# Patient Record
Sex: Female | Born: 1985
Health system: Southern US, Community
[De-identification: ages and names within clinical notes are randomized; demographics above are authoritative.]

## PROBLEM LIST (undated history)

## (undated) DIAGNOSIS — M2550 Pain in unspecified joint: Secondary | ICD-10-CM

## (undated) DIAGNOSIS — I10 Essential (primary) hypertension: Secondary | ICD-10-CM

## (undated) DIAGNOSIS — E039 Hypothyroidism, unspecified: Secondary | ICD-10-CM

## (undated) DIAGNOSIS — O1490 Unspecified pre-eclampsia, unspecified trimester: Secondary | ICD-10-CM

## (undated) DIAGNOSIS — R0602 Shortness of breath: Secondary | ICD-10-CM

## (undated) DIAGNOSIS — M549 Dorsalgia, unspecified: Secondary | ICD-10-CM

## (undated) DIAGNOSIS — K219 Gastro-esophageal reflux disease without esophagitis: Secondary | ICD-10-CM

## (undated) DIAGNOSIS — T7840XA Allergy, unspecified, initial encounter: Secondary | ICD-10-CM

## (undated) DIAGNOSIS — J45909 Unspecified asthma, uncomplicated: Secondary | ICD-10-CM

## (undated) HISTORY — PX: WISDOM TOOTH EXTRACTION: SHX21

## (undated) HISTORY — DX: Pain in unspecified joint: M25.50

## (undated) HISTORY — DX: Hypothyroidism, unspecified: E03.9

## (undated) HISTORY — DX: Allergy, unspecified, initial encounter: T78.40XA

## (undated) HISTORY — DX: Shortness of breath: R06.02

## (undated) HISTORY — DX: Unspecified asthma, uncomplicated: J45.909

## (undated) HISTORY — DX: Dorsalgia, unspecified: M54.9

## (undated) HISTORY — DX: Gastro-esophageal reflux disease without esophagitis: K21.9

---
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
## 2005-06-26 IMAGING — CR DG HIP (WITH OR WITHOUT PELVIS) 2-3V*L*
2 series · 2 of 2 positions shown · non-contrast
Comparison: none

CLINICAL DATA: Silver trauma.  Pain.  
 CERVICAL SPINE COMPLETE
 There is no evidence of fracture or prevertebral soft tissue swelling. Alignment is normal. The intervertebral disk spaces are within normal limits and no other significant bone abnormalities are identified.

[view not recorded (1 of 2)]
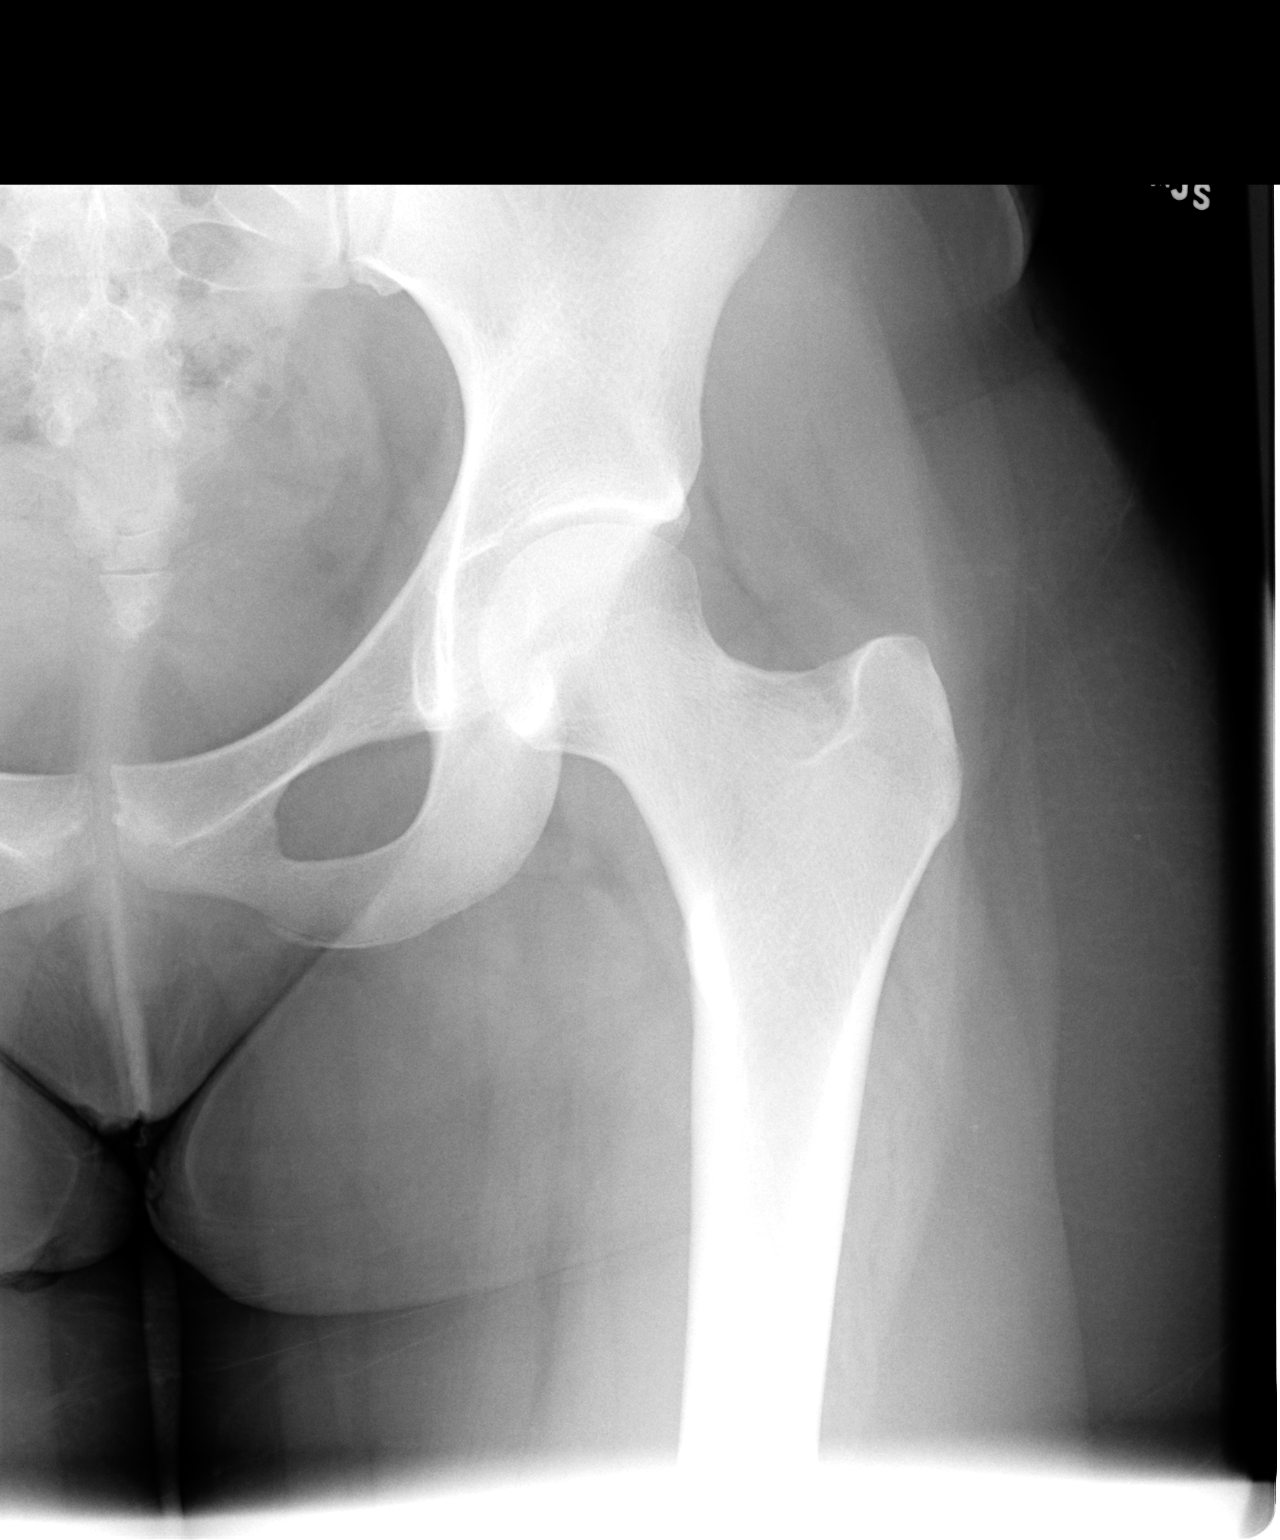

[view not recorded (2 of 2)]
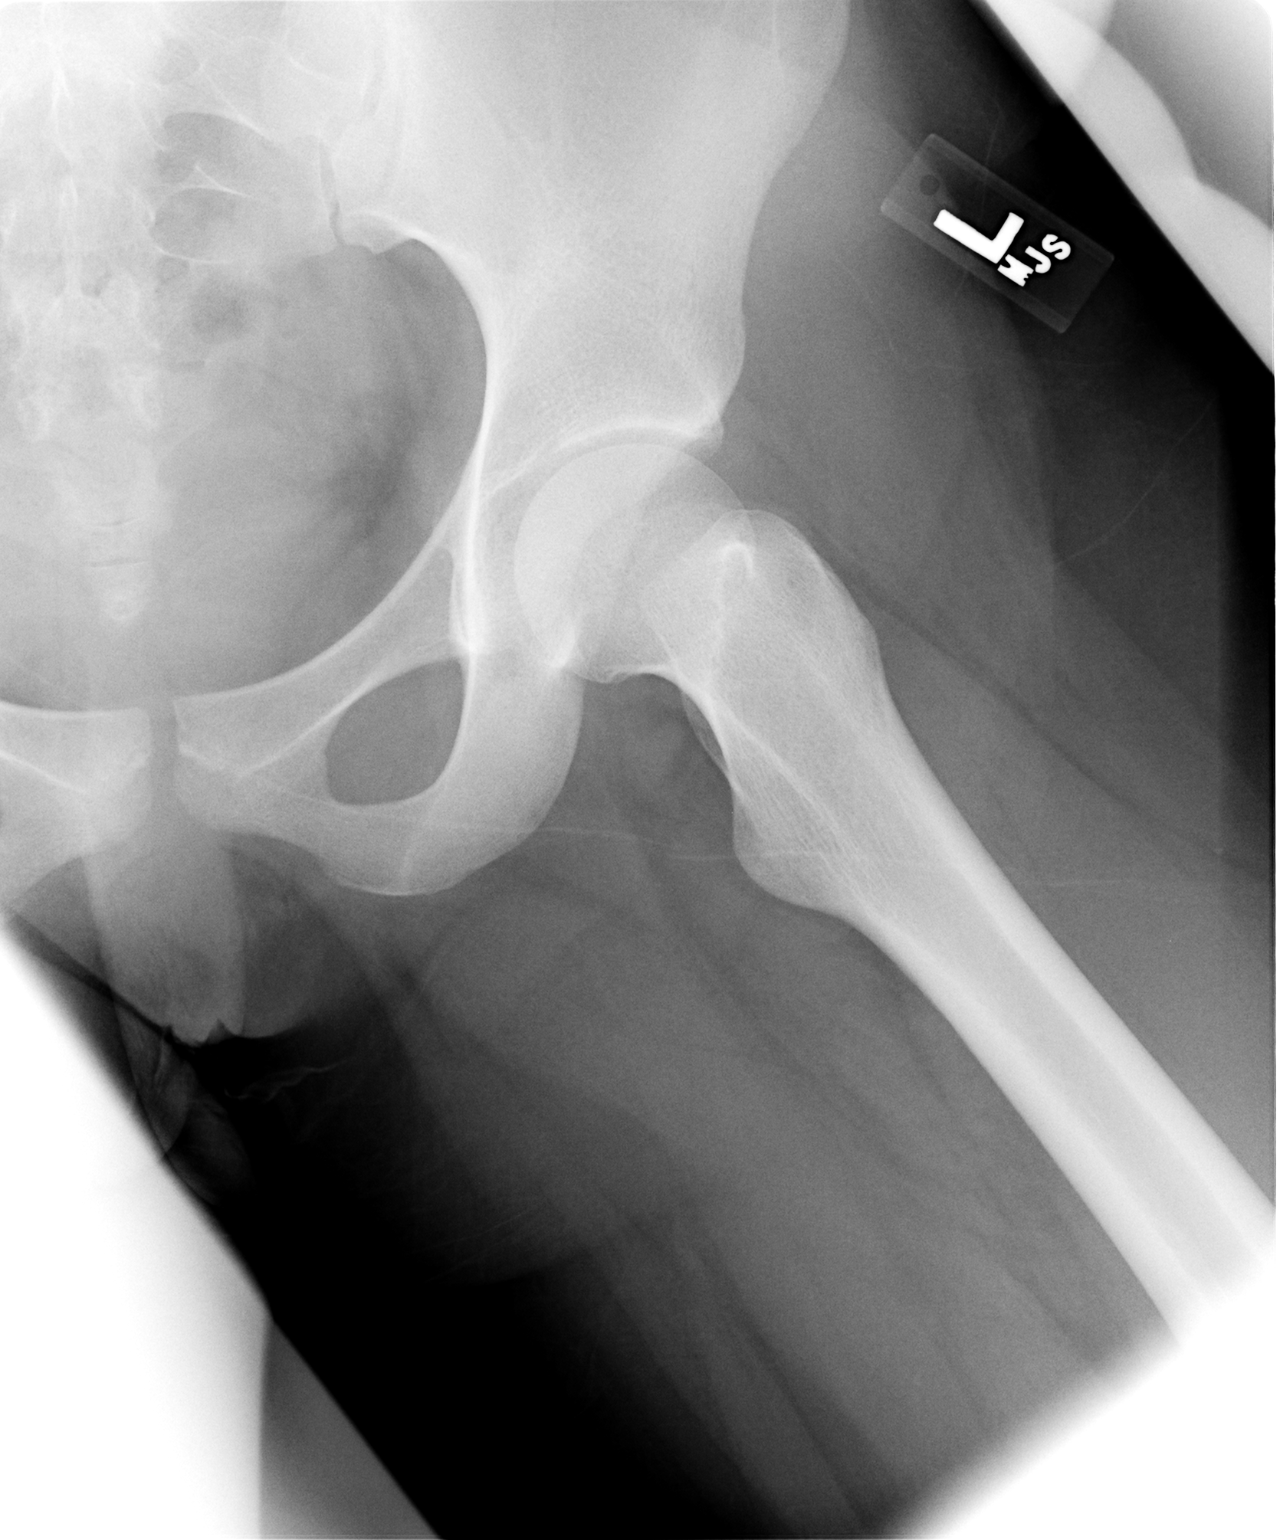

[2 of 2 positions shown; findings below may reference images not displayed]

IMPRESSION
 Negative cervical spine radiographs. 
 LEFT HUMERUS
 There is no evidence of fracture or focal bone lesions. No other significant bone or soft tissue abnormalities are identified.

 IMPRESSION
 Normal study.
 LEFT ELBOW COMPLETE
 There is no evidence of fracture, dislocation, or other significant bone abnormality.  There is no evidence of joint effusion. 

 IMPRESSION
 Normal study. 
 LEFT FEMUR
 There is no evidence of fracture or focal bone lesions. No other significant bone or soft tissue abnormalities are identified.

 IMPRESSION
 Normal study.
 ONE VIEW PELVIS

  There is no evidence of fracture or diastasis. No other significant bone or soft tissue abnormalities are identified.

 IMPRESSION
 Normal study.

 LEFT HIP COMPLETE

  There is no evidence of fracture or dislocation. No other significant bone or soft tissue abnormalities are identified. The joint spaces are within normal limits.

 IMPRESSION
 Normal study.

## 2005-06-26 IMAGING — CR DG FEMUR 2V*L*
2 series · 2 of 2 positions shown · non-contrast
Comparison: none

CLINICAL DATA: Silver trauma.  Pain.  
 CERVICAL SPINE COMPLETE
 There is no evidence of fracture or prevertebral soft tissue swelling. Alignment is normal. The intervertebral disk spaces are within normal limits and no other significant bone abnormalities are identified.

[view not recorded (1 of 2)]
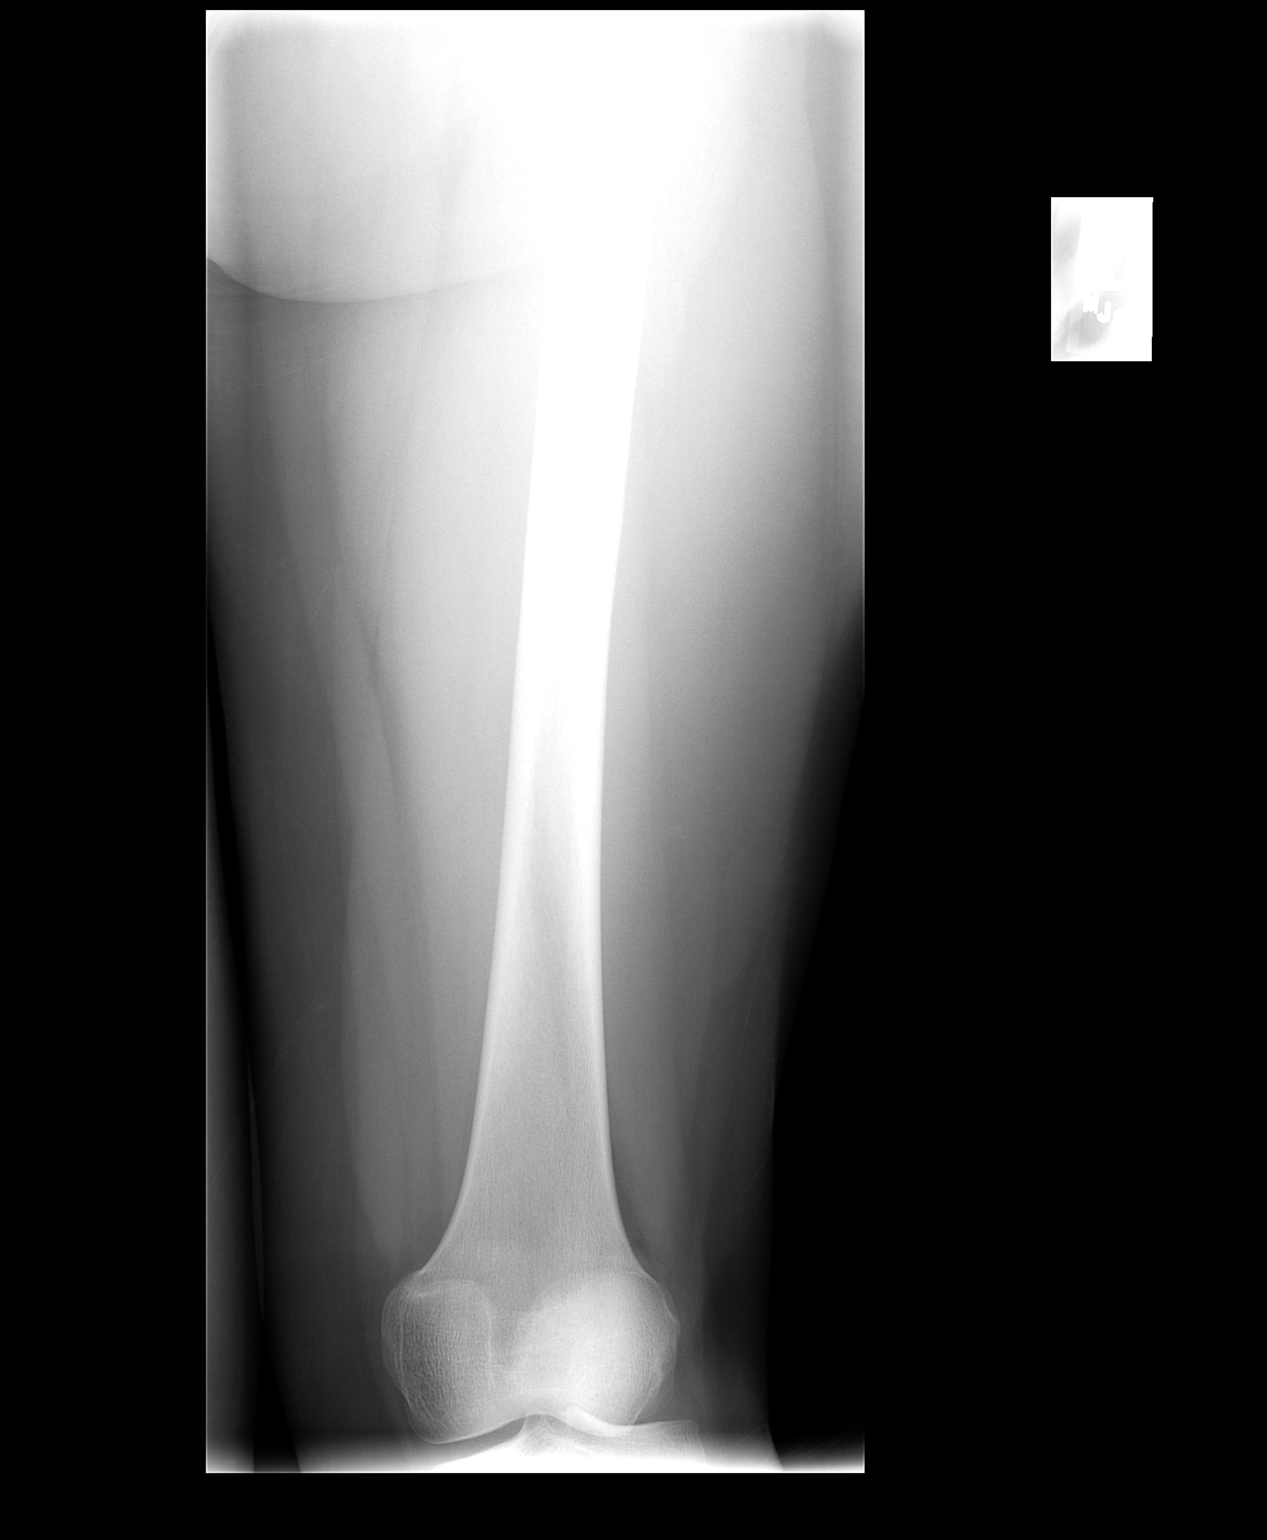

[view not recorded (2 of 2)]
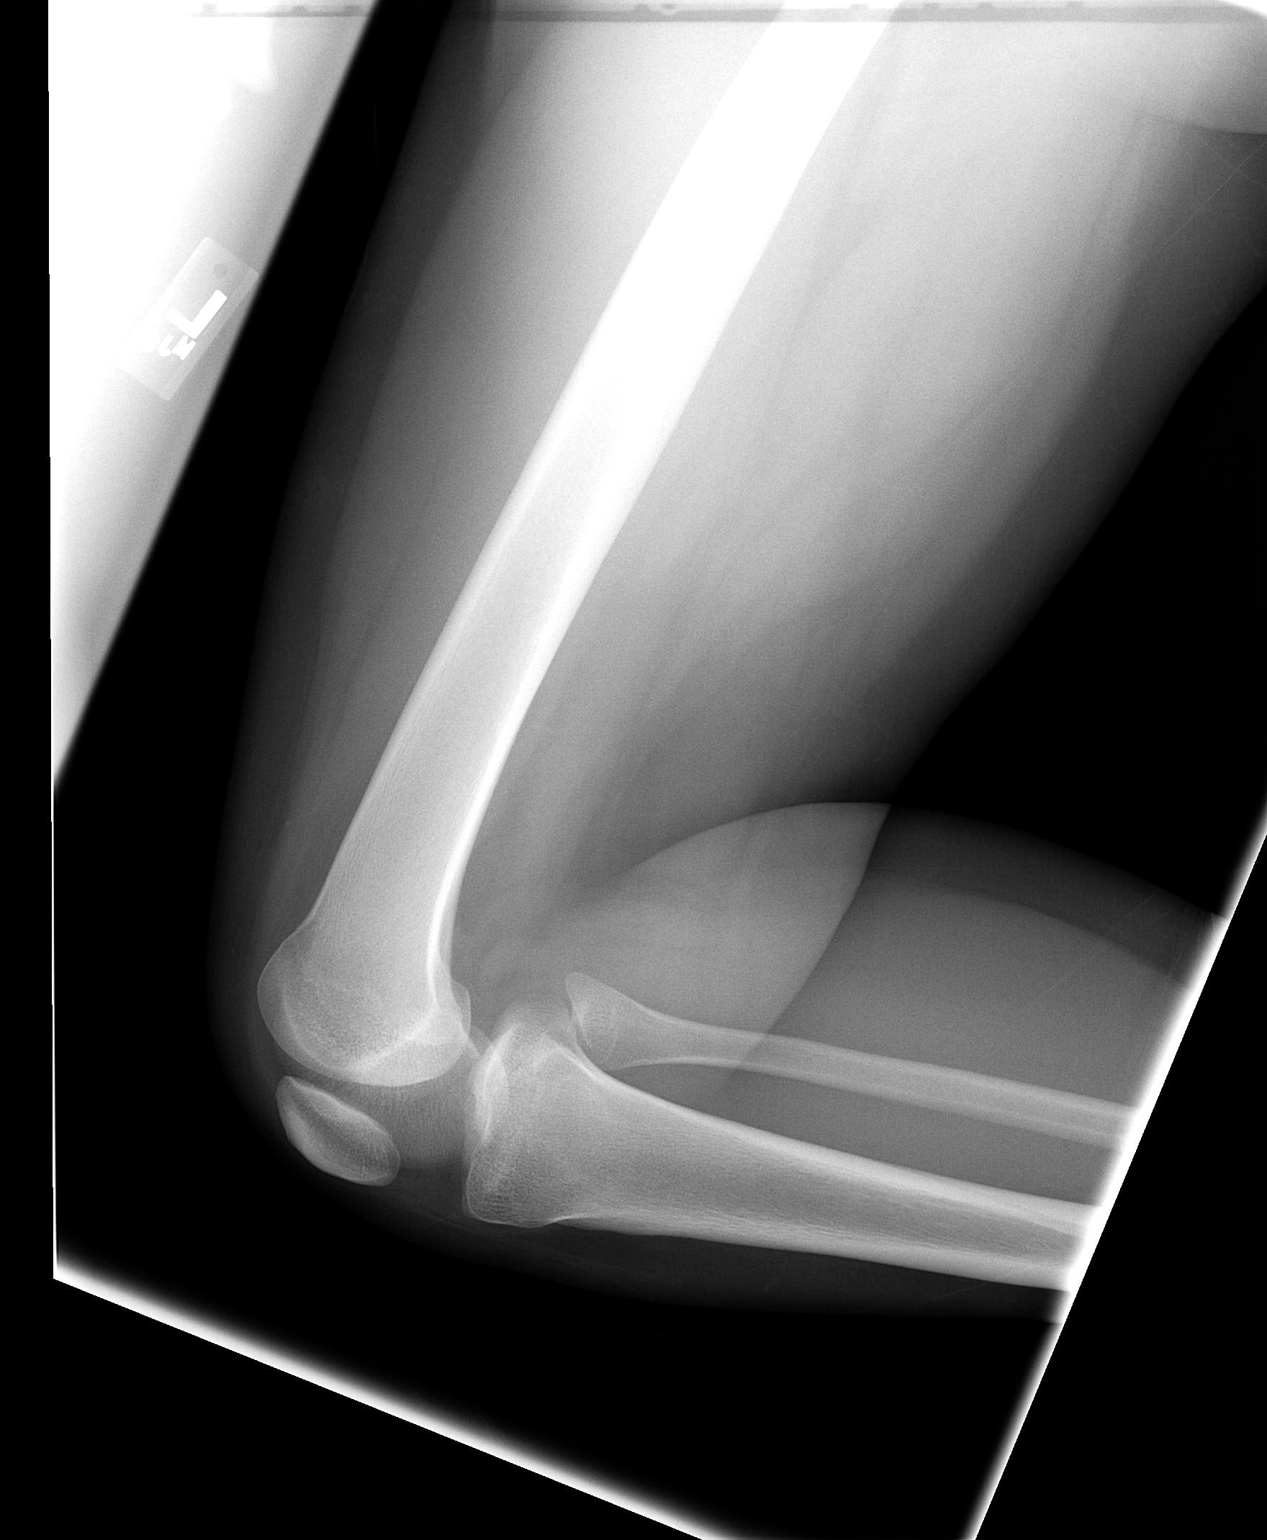

[2 of 2 positions shown; findings below may reference images not displayed]

IMPRESSION
 Negative cervical spine radiographs. 
 LEFT HUMERUS
 There is no evidence of fracture or focal bone lesions. No other significant bone or soft tissue abnormalities are identified.

 IMPRESSION
 Normal study.
 LEFT ELBOW COMPLETE
 There is no evidence of fracture, dislocation, or other significant bone abnormality.  There is no evidence of joint effusion. 

 IMPRESSION
 Normal study. 
 LEFT FEMUR
 There is no evidence of fracture or focal bone lesions. No other significant bone or soft tissue abnormalities are identified.

 IMPRESSION
 Normal study.
 ONE VIEW PELVIS

  There is no evidence of fracture or diastasis. No other significant bone or soft tissue abnormalities are identified.

 IMPRESSION
 Normal study.

 LEFT HIP COMPLETE

  There is no evidence of fracture or dislocation. No other significant bone or soft tissue abnormalities are identified. The joint spaces are within normal limits.

 IMPRESSION
 Normal study.

## 2015-11-05 DIAGNOSIS — R87610 Atypical squamous cells of undetermined significance on cytologic smear of cervix (ASC-US): Secondary | ICD-10-CM | POA: Diagnosis not present

## 2015-11-05 DIAGNOSIS — Z124 Encounter for screening for malignant neoplasm of cervix: Secondary | ICD-10-CM | POA: Diagnosis not present

## 2015-11-05 DIAGNOSIS — Z0000 Encounter for general adult medical examination without abnormal findings: Secondary | ICD-10-CM | POA: Diagnosis not present

## 2015-11-05 DIAGNOSIS — K219 Gastro-esophageal reflux disease without esophagitis: Secondary | ICD-10-CM | POA: Diagnosis not present

## 2015-12-04 DIAGNOSIS — B977 Papillomavirus as the cause of diseases classified elsewhere: Secondary | ICD-10-CM | POA: Diagnosis not present

## 2015-12-04 DIAGNOSIS — J029 Acute pharyngitis, unspecified: Secondary | ICD-10-CM | POA: Diagnosis not present

## 2015-12-04 DIAGNOSIS — R87810 Cervical high risk human papillomavirus (HPV) DNA test positive: Secondary | ICD-10-CM | POA: Diagnosis not present

## 2015-12-04 DIAGNOSIS — R87610 Atypical squamous cells of undetermined significance on cytologic smear of cervix (ASC-US): Secondary | ICD-10-CM | POA: Diagnosis not present

## 2015-12-04 DIAGNOSIS — Z1151 Encounter for screening for human papillomavirus (HPV): Secondary | ICD-10-CM | POA: Diagnosis not present

## 2016-04-01 DIAGNOSIS — Z01419 Encounter for gynecological examination (general) (routine) without abnormal findings: Secondary | ICD-10-CM | POA: Diagnosis not present

## 2016-04-01 DIAGNOSIS — Z1329 Encounter for screening for other suspected endocrine disorder: Secondary | ICD-10-CM | POA: Diagnosis not present

## 2016-04-01 DIAGNOSIS — E669 Obesity, unspecified: Secondary | ICD-10-CM | POA: Diagnosis not present

## 2016-04-01 DIAGNOSIS — Z1389 Encounter for screening for other disorder: Secondary | ICD-10-CM | POA: Diagnosis not present

## 2016-04-01 DIAGNOSIS — Z131 Encounter for screening for diabetes mellitus: Secondary | ICD-10-CM | POA: Diagnosis not present

## 2016-04-01 DIAGNOSIS — Z6832 Body mass index (BMI) 32.0-32.9, adult: Secondary | ICD-10-CM | POA: Diagnosis not present

## 2016-04-26 DIAGNOSIS — H5212 Myopia, left eye: Secondary | ICD-10-CM | POA: Diagnosis not present

## 2016-04-26 DIAGNOSIS — H43313 Vitreous membranes and strands, bilateral: Secondary | ICD-10-CM | POA: Diagnosis not present

## 2016-05-03 DIAGNOSIS — Z6832 Body mass index (BMI) 32.0-32.9, adult: Secondary | ICD-10-CM | POA: Diagnosis not present

## 2016-05-03 DIAGNOSIS — R102 Pelvic and perineal pain: Secondary | ICD-10-CM | POA: Diagnosis not present

## 2016-05-03 DIAGNOSIS — E669 Obesity, unspecified: Secondary | ICD-10-CM | POA: Diagnosis not present

## 2016-11-04 DIAGNOSIS — M25562 Pain in left knee: Secondary | ICD-10-CM | POA: Diagnosis not present

## 2016-11-04 DIAGNOSIS — M222X2 Patellofemoral disorders, left knee: Secondary | ICD-10-CM | POA: Diagnosis not present

## 2016-12-21 DIAGNOSIS — M25562 Pain in left knee: Secondary | ICD-10-CM | POA: Diagnosis not present

## 2017-02-11 DIAGNOSIS — Z01419 Encounter for gynecological examination (general) (routine) without abnormal findings: Secondary | ICD-10-CM | POA: Diagnosis not present

## 2017-02-24 DIAGNOSIS — Z131 Encounter for screening for diabetes mellitus: Secondary | ICD-10-CM | POA: Diagnosis not present

## 2017-02-24 DIAGNOSIS — Z13220 Encounter for screening for lipoid disorders: Secondary | ICD-10-CM | POA: Diagnosis not present

## 2017-04-25 DIAGNOSIS — J069 Acute upper respiratory infection, unspecified: Secondary | ICD-10-CM | POA: Diagnosis not present

## 2017-06-30 DIAGNOSIS — Z23 Encounter for immunization: Secondary | ICD-10-CM | POA: Diagnosis not present

## 2018-04-15 DIAGNOSIS — J040 Acute laryngitis: Secondary | ICD-10-CM | POA: Diagnosis not present

## 2018-04-28 DIAGNOSIS — J069 Acute upper respiratory infection, unspecified: Secondary | ICD-10-CM | POA: Diagnosis not present

## 2018-05-09 DIAGNOSIS — J019 Acute sinusitis, unspecified: Secondary | ICD-10-CM | POA: Diagnosis not present

## 2018-06-19 DIAGNOSIS — Z131 Encounter for screening for diabetes mellitus: Secondary | ICD-10-CM | POA: Diagnosis not present

## 2018-06-19 DIAGNOSIS — Z13220 Encounter for screening for lipoid disorders: Secondary | ICD-10-CM | POA: Diagnosis not present

## 2018-06-19 DIAGNOSIS — Z6835 Body mass index (BMI) 35.0-35.9, adult: Secondary | ICD-10-CM | POA: Diagnosis not present

## 2018-06-19 DIAGNOSIS — Z0000 Encounter for general adult medical examination without abnormal findings: Secondary | ICD-10-CM | POA: Diagnosis not present

## 2018-06-19 DIAGNOSIS — E669 Obesity, unspecified: Secondary | ICD-10-CM | POA: Diagnosis not present

## 2018-06-19 DIAGNOSIS — Z01419 Encounter for gynecological examination (general) (routine) without abnormal findings: Secondary | ICD-10-CM | POA: Diagnosis not present

## 2018-07-02 DIAGNOSIS — J0110 Acute frontal sinusitis, unspecified: Secondary | ICD-10-CM | POA: Diagnosis not present

## 2018-07-06 DIAGNOSIS — J0110 Acute frontal sinusitis, unspecified: Secondary | ICD-10-CM | POA: Diagnosis not present

## 2019-06-11 DIAGNOSIS — R05 Cough: Secondary | ICD-10-CM | POA: Diagnosis not present

## 2019-06-16 DIAGNOSIS — R05 Cough: Secondary | ICD-10-CM | POA: Diagnosis not present

## 2019-06-22 DIAGNOSIS — R05 Cough: Secondary | ICD-10-CM | POA: Diagnosis not present

## 2019-08-09 DIAGNOSIS — Z13220 Encounter for screening for lipoid disorders: Secondary | ICD-10-CM | POA: Diagnosis not present

## 2019-08-09 DIAGNOSIS — B977 Papillomavirus as the cause of diseases classified elsewhere: Secondary | ICD-10-CM | POA: Diagnosis not present

## 2019-08-09 DIAGNOSIS — Z130 Encounter for screening for diseases of the blood and blood-forming organs and certain disorders involving the immune mechanism: Secondary | ICD-10-CM | POA: Diagnosis not present

## 2019-08-09 DIAGNOSIS — Z1329 Encounter for screening for other suspected endocrine disorder: Secondary | ICD-10-CM | POA: Diagnosis not present

## 2019-08-09 DIAGNOSIS — D225 Melanocytic nevi of trunk: Secondary | ICD-10-CM | POA: Diagnosis not present

## 2019-08-09 DIAGNOSIS — Z124 Encounter for screening for malignant neoplasm of cervix: Secondary | ICD-10-CM | POA: Diagnosis not present

## 2019-08-09 DIAGNOSIS — Z01419 Encounter for gynecological examination (general) (routine) without abnormal findings: Secondary | ICD-10-CM | POA: Diagnosis not present

## 2019-08-09 DIAGNOSIS — R7301 Impaired fasting glucose: Secondary | ICD-10-CM | POA: Diagnosis not present

## 2019-08-09 DIAGNOSIS — L821 Other seborrheic keratosis: Secondary | ICD-10-CM | POA: Diagnosis not present

## 2019-08-09 DIAGNOSIS — L218 Other seborrheic dermatitis: Secondary | ICD-10-CM | POA: Diagnosis not present

## 2019-08-09 DIAGNOSIS — K219 Gastro-esophageal reflux disease without esophagitis: Secondary | ICD-10-CM | POA: Diagnosis not present

## 2019-08-09 DIAGNOSIS — Z6837 Body mass index (BMI) 37.0-37.9, adult: Secondary | ICD-10-CM | POA: Diagnosis not present

## 2019-08-09 DIAGNOSIS — R7989 Other specified abnormal findings of blood chemistry: Secondary | ICD-10-CM | POA: Diagnosis not present

## 2019-08-09 DIAGNOSIS — Z1151 Encounter for screening for human papillomavirus (HPV): Secondary | ICD-10-CM | POA: Diagnosis not present

## 2019-08-09 DIAGNOSIS — N72 Inflammatory disease of cervix uteri: Secondary | ICD-10-CM | POA: Diagnosis not present

## 2019-08-09 DIAGNOSIS — Z0000 Encounter for general adult medical examination without abnormal findings: Secondary | ICD-10-CM | POA: Diagnosis not present

## 2019-08-09 DIAGNOSIS — L814 Other melanin hyperpigmentation: Secondary | ICD-10-CM | POA: Diagnosis not present

## 2019-08-09 DIAGNOSIS — R635 Abnormal weight gain: Secondary | ICD-10-CM | POA: Diagnosis not present

## 2019-10-03 DIAGNOSIS — E039 Hypothyroidism, unspecified: Secondary | ICD-10-CM | POA: Diagnosis not present

## 2019-10-17 DIAGNOSIS — Z3149 Encounter for other procreative investigation and testing: Secondary | ICD-10-CM | POA: Diagnosis not present

## 2019-11-05 DIAGNOSIS — J00 Acute nasopharyngitis [common cold]: Secondary | ICD-10-CM | POA: Diagnosis not present

## 2019-11-13 DIAGNOSIS — J019 Acute sinusitis, unspecified: Secondary | ICD-10-CM | POA: Diagnosis not present

## 2019-11-13 DIAGNOSIS — B9689 Other specified bacterial agents as the cause of diseases classified elsewhere: Secondary | ICD-10-CM | POA: Diagnosis not present

## 2019-11-13 DIAGNOSIS — J45909 Unspecified asthma, uncomplicated: Secondary | ICD-10-CM | POA: Insufficient documentation

## 2019-11-13 DIAGNOSIS — R05 Cough: Secondary | ICD-10-CM | POA: Diagnosis not present

## 2019-11-13 DIAGNOSIS — R0981 Nasal congestion: Secondary | ICD-10-CM | POA: Diagnosis not present

## 2019-11-13 DIAGNOSIS — K219 Gastro-esophageal reflux disease without esophagitis: Secondary | ICD-10-CM | POA: Insufficient documentation

## 2019-11-16 DIAGNOSIS — E309 Disorder of puberty, unspecified: Secondary | ICD-10-CM | POA: Diagnosis not present

## 2019-11-16 DIAGNOSIS — E039 Hypothyroidism, unspecified: Secondary | ICD-10-CM | POA: Diagnosis not present

## 2020-11-26 DIAGNOSIS — N970 Female infertility associated with anovulation: Secondary | ICD-10-CM | POA: Diagnosis not present

## 2020-11-26 DIAGNOSIS — Z319 Encounter for procreative management, unspecified: Secondary | ICD-10-CM | POA: Diagnosis not present

## 2020-11-26 LAB — HEMOGLOBIN A1C: Hemoglobin A1C: 5.6

## 2020-11-26 LAB — TSH: TSH: 3.69 (ref <=5.90)

## 2020-12-10 ENCOUNTER — Other Ambulatory Visit: Payer: Self-pay

## 2020-12-10 ENCOUNTER — Encounter (INDEPENDENT_AMBULATORY_CARE_PROVIDER_SITE_OTHER): Payer: Self-pay | Admitting: Bariatrics

## 2020-12-10 ENCOUNTER — Ambulatory Visit (INDEPENDENT_AMBULATORY_CARE_PROVIDER_SITE_OTHER): Payer: BC Managed Care – PPO | Admitting: Bariatrics

## 2020-12-10 VITALS — BP 143/84 | HR 78 | Temp 98.0°F | Ht 64.0 in | Wt 205.0 lb

## 2020-12-10 DIAGNOSIS — K219 Gastro-esophageal reflux disease without esophagitis: Secondary | ICD-10-CM

## 2020-12-10 DIAGNOSIS — E559 Vitamin D deficiency, unspecified: Secondary | ICD-10-CM | POA: Diagnosis not present

## 2020-12-10 DIAGNOSIS — R5383 Other fatigue: Secondary | ICD-10-CM | POA: Diagnosis not present

## 2020-12-10 DIAGNOSIS — Z6835 Body mass index (BMI) 35.0-35.9, adult: Secondary | ICD-10-CM

## 2020-12-10 DIAGNOSIS — E038 Other specified hypothyroidism: Secondary | ICD-10-CM | POA: Diagnosis not present

## 2020-12-10 DIAGNOSIS — R0602 Shortness of breath: Secondary | ICD-10-CM

## 2020-12-10 DIAGNOSIS — R7309 Other abnormal glucose: Secondary | ICD-10-CM | POA: Diagnosis not present

## 2020-12-10 DIAGNOSIS — Z1331 Encounter for screening for depression: Secondary | ICD-10-CM | POA: Diagnosis not present

## 2020-12-10 DIAGNOSIS — E7800 Pure hypercholesterolemia, unspecified: Secondary | ICD-10-CM

## 2020-12-10 DIAGNOSIS — Z0289 Encounter for other administrative examinations: Secondary | ICD-10-CM

## 2020-12-10 DIAGNOSIS — Z9189 Other specified personal risk factors, not elsewhere classified: Secondary | ICD-10-CM | POA: Diagnosis not present

## 2020-12-10 DIAGNOSIS — M25569 Pain in unspecified knee: Secondary | ICD-10-CM

## 2020-12-10 DIAGNOSIS — E66812 Obesity, class 2: Secondary | ICD-10-CM

## 2020-12-11 ENCOUNTER — Encounter (INDEPENDENT_AMBULATORY_CARE_PROVIDER_SITE_OTHER): Payer: Self-pay | Admitting: Bariatrics

## 2020-12-11 DIAGNOSIS — E66812 Obesity, class 2: Secondary | ICD-10-CM | POA: Insufficient documentation

## 2020-12-11 DIAGNOSIS — E559 Vitamin D deficiency, unspecified: Secondary | ICD-10-CM | POA: Insufficient documentation

## 2020-12-11 DIAGNOSIS — E6609 Other obesity due to excess calories: Secondary | ICD-10-CM | POA: Insufficient documentation

## 2020-12-11 DIAGNOSIS — E7800 Pure hypercholesterolemia, unspecified: Secondary | ICD-10-CM | POA: Insufficient documentation

## 2020-12-11 LAB — LIPID PANEL WITH LDL/HDL RATIO
Cholesterol, Total: 225 mg/dL — ABNORMAL HIGH (ref 100–199)
HDL: 38 mg/dL — ABNORMAL LOW (ref >39)
LDL Chol Calc (NIH): 155 mg/dL — ABNORMAL HIGH (ref 0–99)
LDL/HDL Ratio: 4.1 ratio — ABNORMAL HIGH (ref 0.0–3.2)
Triglycerides: 176 mg/dL — ABNORMAL HIGH (ref 0–149)
VLDL Cholesterol Cal: 32 mg/dL (ref 5–40)

## 2020-12-11 LAB — INSULIN, RANDOM: INSULIN: 16.6 u[IU]/mL (ref 2.6–24.9)

## 2020-12-11 LAB — VITAMIN D 25 HYDROXY (VIT D DEFICIENCY, FRACTURES): Vit D, 25-Hydroxy: 18.9 ng/mL — ABNORMAL LOW (ref 30.0–100.0)

## 2020-12-16 ENCOUNTER — Encounter (INDEPENDENT_AMBULATORY_CARE_PROVIDER_SITE_OTHER): Payer: Self-pay | Admitting: Bariatrics

## 2020-12-16 ENCOUNTER — Telehealth (INDEPENDENT_AMBULATORY_CARE_PROVIDER_SITE_OTHER): Payer: Self-pay

## 2020-12-16 NOTE — Telephone Encounter (Signed)
Pts medical records request was faxed to [facility] on 12/10/2020, the request was successfully received on 12/10/2020.

## 2020-12-16 NOTE — Progress Notes (Signed)
Chief Complaint:   OBESITY Sarah Levy (MR# [id number]) is a 35 y.o. female who presents for evaluation and treatment of obesity and related comorbidities. Current BMI is Body mass index is 35.19 kg/m. Sarah Levy has been struggling with her weight for many years and has been unsuccessful in either losing weight, maintaining weight loss, or reaching her healthy weight goal.  Sarah Levy is currently in the action stage of change and ready to dedicate time achieving and maintaining a healthier weight. Sarah Levy is interested in becoming our patient and working on intensive lifestyle modifications including (but not limited to) diet and exercise for weight loss.  Sarah Levy enjoys cooking. She craves carbohydrates at night. She needs to lose weight for fertility treatment.  Sarah Levy's habits were reviewed today and are as follows: Her family eats meals together, she thinks her family will eat healthier with her, her desired weight loss is 70 lbs, she started gaining weight 6 years ago, her heaviest weight ever was 210 pounds, she is a picky eater and doesn't like to eat healthier foods, she has significant food cravings issues, she snacks frequently in the evenings, she skips meals frequently, she is frequently drinking liquids with calories, she frequently makes poor food choices, she frequently eats larger portions than normal, and she struggles with emotional eating.  Depression Screen Sarah Levy's Food and Mood (modified PHQ-9) score was 5.  Depression screen PHQ 2/9 12/10/2020  Decreased Interest 1  Down, Depressed, Hopeless 1  PHQ - 2 Score 2  Altered sleeping 1  Tired, decreased energy 2  Feeling bad or failure about yourself  0  Trouble concentrating 0  Moving slowly or fidgety/restless 0  Suicidal thoughts 0  PHQ-9 Score 5  Difficult doing work/chores Not difficult at all   Subjective:   1. Other fatigue Sarah Levy admits to daytime somnolence and admits to waking up still tired.  Patent has a history of symptoms of daytime fatigue and morning headache. Sarah Levy generally gets 6 or 7 hours of sleep per night, and states that she has generally restful sleep. Snoring is present. Apneic episodes are not present. Epworth Sleepiness Score is 2.  2. SOB (shortness of breath) on exertion Sarah Levy notes increasing shortness of breath with exercising and seems to be worsening over time with weight gain. She notes getting out of breath sooner with activity than she used to. This has gotten worse recently. Sarah Levy denies shortness of breath at rest or orthopnea.  3. Other specified hypothyroidism Sarah Levy's last TSH was 3.690.  4. Knee pain, unspecified chronicity, unspecified laterality [name] is taking ibuprofen.  5. Gastroesophageal reflux disease, unspecified whether esophagitis present Sarah Levy is taking pantoprazole.  6. Vitamin D deficiency Sarah Levy is not on Vit D supplementation.  7. Elevated cholesterol Sarah Levy is not on statin therapy.  8. Elevated glucose Sarah Levy has a family history of diabetes.   9. At risk for activity intolerance Sarah Levy is at risk for exercise intolerance due to back pain.  Assessment/Plan:   1. Other fatigue Sarah Levy does feel that her weight is causing her energy to be lower than it should be. Fatigue may be related to obesity, depression or many other causes. Labs will be ordered, and in the meanwhile, Sarah Levy will focus on self care including making healthy food choices, increasing physical activity and focusing on stress reduction. Check labs today.  - EKG 12-Lead - Insulin, random - Lipid Panel With LDL/HDL Ratio - VITAMIN D 25 Hydroxy (Vit-D Deficiency, Fractures)  2. SOB (shortness  of breath) on exertion Sarah Levy does feel that she gets out of breath more easily that she used to when she exercises. Sarah Levy's shortness of breath appears to be obesity related and exercise induced. She has agreed to work on weight loss and gradually  increase exercise to treat her exercise induced shortness of breath. Will continue to monitor closely.  3. Other specified hypothyroidism Patient with long-standing hypothyroidism, on levothyroxine therapy. She appears euthyroid. Orders and follow up as documented in patient record. Follow up with OB/GYN.  Counseling Good thyroid control is important for overall health. Supratherapeutic thyroid levels are dangerous and will not improve weight loss results. The correct way to take levothyroxine is fasting, with water, separated by at least 30 minutes from breakfast, and separated by more than 4 hours from calcium, iron, multivitamins, acid reflux medications (PPIs).   4. Knee pain, unspecified chronicity, unspecified laterality She will continue Ibuprofen and will avoid pounding exercises.   5. Gastroesophageal reflux disease, unspecified whether esophagitis present Intensive lifestyle modifications are the first line treatment for this issue. We discussed several lifestyle modifications today and she will continue to work on diet, exercise and weight loss efforts. Orders and follow up as documented in patient record.   Counseling If a person has gastroesophageal reflux disease (GERD), food and stomach acid move back up into the esophagus and cause symptoms or problems such as damage to the esophagus. Anti-reflux measures include: raising the head of the bed, avoiding tight clothing or belts, avoiding eating late at night, not lying down shortly after mealtime, and achieving weight loss. Avoid ASA, NSAID's, caffeine, alcohol, and tobacco.  OTC Pepcid and/or Tums are often very helpful for as needed use.  However, for persisting chronic or daily symptoms, stronger medications like Omeprazole may be needed. You may need to avoid foods and drinks such as: Coffee and tea (with or without caffeine). Drinks that contain alcohol. Energy drinks and sports drinks. Bubbly (carbonated) drinks or  sodas. Chocolate and cocoa. Peppermint and mint flavorings. Garlic and onions. Horseradish. Spicy and acidic foods. These include peppers, chili powder, curry powder, vinegar, hot sauces, and BBQ sauce. Citrus fruit juices and citrus fruits, such as oranges, lemons, and limes. Tomato-based foods. These include red sauce, chili, salsa, and pizza with red sauce. Fried and fatty foods. These include donuts, french fries, potato chips, and high-fat dressings. High-fat meats. These include hot dogs, rib eye steak, sausage, ham, and bacon.  6. Vitamin D deficiency Low Vitamin D level contributes to fatigue and are associated with obesity, breast, and colon cancer. She agrees to follow-up for routine testing of Vitamin D, at least 2-3 times per year to avoid over-replacement. Check labs today.  - VITAMIN D 25 Hydroxy (Vit-D Deficiency, Fractures)  7. Elevated cholesterol Cardiovascular risk and specific lipid/LDL goals reviewed.  We discussed several lifestyle modifications today and Parul will continue to work on diet, exercise and weight loss efforts. Orders and follow up as documented in patient record.   Counseling Intensive lifestyle modifications are the first line treatment for this issue. Dietary changes: Increase soluble fiber. Decrease simple carbohydrates. Exercise changes: Moderate to vigorous-intensity aerobic activity 150 minutes per week if tolerated. Lipid-lowering medications: see documented in medical record.  8. Elevated glucose Fasting labs will be obtained and results with be discussed with Belenda Cruise in 2 weeks at her follow up visit. In the meanwhile Laverta was started on a lower simple carbohydrate diet and will work on weight loss efforts.  - Insulin, random  9. Depression screen Tayen had a positive depression screening. Depression is commonly associated with obesity and often results in emotional eating behaviors. We will monitor this closely and work on CBT to  help improve the non-hunger eating patterns. Referral to Psychology may be required if no improvement is seen as she continues in our clinic.  10. At risk for activity intolerance Kasandra was given approximately 15 minutes of exercise intolerance counseling today. She is 35 y.o. female and has risk factors exercise intolerance including obesity. We discussed intensive lifestyle modifications today with an emphasis on specific weight loss instructions and strategies. Kierstan will slowly increase activity as tolerated.  Repetitive spaced learning was employed today to elicit superior memory formation and behavioral change.   11. Class 2 severe obesity with serious comorbidity and body mass index (BMI) of 35.0 to 35.9 in adult, unspecified obesity type (HCC)  Tyerra is currently in the action stage of change and her goal is to continue with weight loss efforts. I recommend Anicia begin the structured treatment plan as follows:  No regular soda, no sugary drinks.  She has agreed to the Category 2 Plan.  Exercise goals: No exercise has been prescribed at this time.   Behavioral modification strategies: increasing lean protein intake, decreasing simple carbohydrates, increasing vegetables, increasing water intake, decreasing eating out, no skipping meals, meal planning and cooking strategies, keeping healthy foods in the home, and planning for success.  She was informed of the importance of frequent follow-up visits to maximize her success with intensive lifestyle modifications for her multiple health conditions. She was informed we would discuss her lab results at her next visit unless there is a critical issue that needs to be addressed sooner. Mabry agreed to keep her next visit at the agreed upon time to discuss these results.  Objective:   Blood pressure (!) 143/84, pulse 78, temperature 98 F (36.7 C), height 5\' 4"  (1.626 m), weight 205 lb (93 kg), last menstrual period 12/01/2020, SpO2  99 %. Body mass index is 35.19 kg/m.  EKG: Normal sinus rhythm, rate 84.  Indirect Calorimeter completed today shows a VO2 of 249 and a REE of 1714.  Her calculated basal metabolic rate is 12/03/2020 thus her basal metabolic rate is better than expected.  General: Cooperative, alert, well developed, in no acute distress. HEENT: Conjunctivae and lids unremarkable. Cardiovascular: Regular rhythm.  Lungs: Normal work of breathing. Neurologic: No focal deficits.   No results found for: CREATININE, BUN, NA, K, CL, CO2 No results found for: ALT, AST, GGT, ALKPHOS, BILITOT No results found for: HGBA1C Lab Results  Component Value Date   INSULIN 16.6 12/10/2020   No results found for: TSH Lab Results  Component Value Date   CHOL 225 (H) 12/10/2020   HDL 38 (L) 12/10/2020   LDLCALC 155 (H) 12/10/2020   TRIG 176 (H) 12/10/2020   No results found for: WBC, HGB, HCT, MCV, PLT No results found for: IRON, TIBC, FERRITIN  Attestation Statements:   Reviewed by clinician on day of visit: allergies, medications, problem list, medical history, surgical history, family history, social history, and previous encounter notes.  12/12/2020, CMA, am acting as Edmund Hilda for Energy manager, DO.  I have reviewed the above documentation for accuracy and completeness, and I agree with the above. [employer], DO

## 2020-12-16 NOTE — Telephone Encounter (Signed)
Pts medical records request was faxed to [facility] on 12/10/2020, the request was successfully received on 12/10/2020.

## 2020-12-24 ENCOUNTER — Encounter (INDEPENDENT_AMBULATORY_CARE_PROVIDER_SITE_OTHER): Payer: Self-pay | Admitting: Bariatrics

## 2020-12-24 ENCOUNTER — Other Ambulatory Visit (INDEPENDENT_AMBULATORY_CARE_PROVIDER_SITE_OTHER): Payer: Self-pay

## 2020-12-24 ENCOUNTER — Other Ambulatory Visit: Payer: Self-pay

## 2020-12-24 ENCOUNTER — Ambulatory Visit (INDEPENDENT_AMBULATORY_CARE_PROVIDER_SITE_OTHER): Payer: BC Managed Care – PPO | Admitting: Bariatrics

## 2020-12-24 VITALS — BP 143/86 | HR 76 | Temp 98.4°F | Ht 64.0 in | Wt 205.0 lb

## 2020-12-24 DIAGNOSIS — E786 Lipoprotein deficiency: Secondary | ICD-10-CM

## 2020-12-24 DIAGNOSIS — Z9189 Other specified personal risk factors, not elsewhere classified: Secondary | ICD-10-CM

## 2020-12-24 DIAGNOSIS — E88819 Insulin resistance, unspecified: Secondary | ICD-10-CM

## 2020-12-24 DIAGNOSIS — E559 Vitamin D deficiency, unspecified: Secondary | ICD-10-CM

## 2020-12-24 DIAGNOSIS — E66812 Obesity, class 2: Secondary | ICD-10-CM

## 2020-12-24 DIAGNOSIS — Z6835 Body mass index (BMI) 35.0-35.9, adult: Secondary | ICD-10-CM

## 2020-12-24 DIAGNOSIS — E7800 Pure hypercholesterolemia, unspecified: Secondary | ICD-10-CM

## 2020-12-24 DIAGNOSIS — E8881 Metabolic syndrome: Secondary | ICD-10-CM | POA: Diagnosis not present

## 2020-12-24 MED ORDER — VITAMIN D (ERGOCALCIFEROL) 1.25 MG (50000 UNIT) PO CAPS: 50000.0000 [IU] | ORAL_CAPSULE | ORAL | 0 refills | Status: DC

## 2020-12-25 NOTE — Progress Notes (Signed)
Chief Complaint:   OBESITY Sarah Levy is here to discuss her progress with her obesity treatment plan along with follow-up of her obesity related diagnoses. Sarah Levy is on the Category 2 Plan, keeping a food journal and adhering to recommended goals of 1200 calories and 80-85 protein, and the Pescatarian Plan and states she is following her eating plan approximately 50% of the time. Sarah Levy states she is doing 0 minutes 0 times per week.  Today's visit was #: 2 Starting weight: 205 lbs Starting date: 12/10/2020 Today's weight: 205 lbs Today's date: 12/24/2020 Total lbs lost to date: 0 Total lbs lost since last in-office visit: 0  Interim History: Sarah Levy weight remains the same. She states that she has had a hard time getting all of her protein. She has also had company over at times which complicated mealtimes.  Subjective:   1. Vitamin D deficiency Sarah Levy is taking Vitamin D. Her Vitamin D range is 18.9.  2. Insulin resistance Sarah Levy's IR is mild. Her IR range is 16.6.  3. Elevated cholesterol Sarah Levy's HDL has decreased. Her LDL range is 155, Total cholesterol range is 225. Her LDL/HDL is elevated.  4. Low HDL (under 40) Sarah Levy's HDL range is 30.  5. At risk for osteoporosis Sarah Levy is at higher risk of osteopenia and osteoporosis due to Vitamin D deficiency.    Assessment/Plan:   1. Vitamin D deficiency Low Vitamin D level contributes to fatigue and are associated with obesity, breast, and colon cancer. We will refill prescription Vitamin D for 1 month. Sarah Levy will follow-up for routine testing of Vitamin D, at least 2-3 times per year to avoid over-replacement.  - Vitamin D, Ergocalciferol, (DRISDOL) 1.25 MG (50000 UNIT) CAPS capsule; Take 1 capsule (50,000 Units total) by mouth every 7 (seven) days.  Dispense: 4 capsule; Refill: 0  2. Insulin resistance Sarah Levy will decrease carbohydrates and will continue to work on weight loss, exercise, and decreasing simple  carbohydrates to help decrease the risk of diabetes. A handout of Insulin Resistance was given today. Sarah Levy agreed to follow-up with [place] as directed to closely monitor her progress.   3. Elevated cholesterol Sarah Levy is working on healthy weight loss and exercise to improve blood pressure control. Sarah Levy will continue no trans fats and saturated fats.She will increase PUFAs and MUFAs. We will watch for signs of hypotension as she continues her lifestyle modifications. We discussed no ASCVD.  4. Low HDL (under 40) Sarah Levy is working on healthy weight loss and exercise to improve blood pressure control. We will watch for signs of hypotension as she continues her lifestyle modifications.   5. At risk for osteoporosis Sarah Levy was given approximately 15 minutes of osteoporosis prevention counseling today. Sarah Levy is at risk for osteopenia and osteoporosis due to her Vitamin D deficiency. She was encouraged to take her Vitamin D and follow her higher calcium diet and increase strengthening exercise to help strengthen her bones and decrease her risk of osteopenia and osteoporosis.  Repetitive spaced learning was employed today to elicit superior memory formation and behavioral change.   6. Obesity,m current BMI 35.2 Sarah Levy is currently in the action stage of change. As such, her goal is to continue with weight loss efforts. She has agreed to the Category 2 Plan, keeping a food journal and adhering to recommended goals of 1200 calories and 80-85 protein, and the Pescatarian Plan.   Sarah Levy will stick closely to the plan. She will continue meal planning. I will review labs from 12/06/2020 with patient.  Exercise goals: No exercise has been prescribed at this time.  Behavioral modification strategies: increasing lean protein intake, decreasing simple carbohydrates, increasing vegetables, increasing water intake, decreasing eating out, no skipping meals, meal planning and cooking strategies, keeping  healthy foods in the home, and planning for success.  Sarah Levy has agreed to follow-up with our clinic in 2 weeks. She was informed of the importance of frequent follow-up visits to maximize her success with intensive lifestyle modifications for her multiple health conditions.   Objective:   Blood pressure (!) 143/86, pulse 76, temperature 98.4 F (36.9 C), height 5\' 4"  (1.626 m), weight 205 lb (93 kg), last menstrual period 12/01/2020, SpO2 99 %. Body mass index is 35.19 kg/m.  General: Cooperative, alert, well developed, in no acute distress. HEENT: Conjunctivae and lids unremarkable. Cardiovascular: Regular rhythm.  Lungs: Normal work of breathing. Neurologic: No focal deficits.   No results found for: CREATININE, BUN, NA, K, CL, CO2 No results found for: ALT, AST, GGT, ALKPHOS, BILITOT Lab Results  Component Value Date   HGBA1C 5.6 11/26/2020   Lab Results  Component Value Date   INSULIN 16.6 12/10/2020   Lab Results  Component Value Date   TSH 3.69 11/26/2020   Lab Results  Component Value Date   CHOL 225 (H) 12/10/2020   HDL 38 (L) 12/10/2020   LDLCALC 155 (H) 12/10/2020   TRIG 176 (H) 12/10/2020   Lab Results  Component Value Date   VD25OH 18.9 (L) 12/10/2020   No results found for: WBC, HGB, HCT, MCV, PLT No results found for: IRON, TIBC, FERRITIN  Attestation Statements:   Reviewed by clinician on day of visit: allergies, medications, problem list, medical history, surgical history, family history, social history, and previous encounter notes.  I, 12/12/2020, RMA, am acting as Jackson Latino for Energy manager, DO.   I have reviewed the above documentation for accuracy and completeness, and I agree with the above. [employer], DO

## 2020-12-29 ENCOUNTER — Encounter (INDEPENDENT_AMBULATORY_CARE_PROVIDER_SITE_OTHER): Payer: Self-pay | Admitting: Bariatrics

## 2021-01-07 ENCOUNTER — Ambulatory Visit (INDEPENDENT_AMBULATORY_CARE_PROVIDER_SITE_OTHER): Payer: BC Managed Care – PPO | Admitting: Bariatrics

## 2021-01-07 ENCOUNTER — Encounter (INDEPENDENT_AMBULATORY_CARE_PROVIDER_SITE_OTHER): Payer: Self-pay | Admitting: Bariatrics

## 2021-01-07 ENCOUNTER — Other Ambulatory Visit: Payer: Self-pay

## 2021-01-07 VITALS — BP 135/82 | HR 75 | Temp 98.1°F | Ht 64.0 in | Wt 201.0 lb

## 2021-01-07 DIAGNOSIS — Z9189 Other specified personal risk factors, not elsewhere classified: Secondary | ICD-10-CM

## 2021-01-07 DIAGNOSIS — Z6835 Body mass index (BMI) 35.0-35.9, adult: Secondary | ICD-10-CM

## 2021-01-07 DIAGNOSIS — E7800 Pure hypercholesterolemia, unspecified: Secondary | ICD-10-CM | POA: Diagnosis not present

## 2021-01-07 DIAGNOSIS — E559 Vitamin D deficiency, unspecified: Secondary | ICD-10-CM

## 2021-01-07 DIAGNOSIS — E8881 Metabolic syndrome: Secondary | ICD-10-CM

## 2021-01-07 MED ORDER — VITAMIN D (ERGOCALCIFEROL) 1.25 MG (50000 UNIT) PO CAPS: 50000.0000 [IU] | ORAL_CAPSULE | ORAL | 0 refills | Status: DC

## 2021-01-07 NOTE — Progress Notes (Signed)
D

## 2021-01-09 DIAGNOSIS — Z3141 Encounter for fertility testing: Secondary | ICD-10-CM | POA: Diagnosis not present

## 2021-01-09 DIAGNOSIS — Z3202 Encounter for pregnancy test, result negative: Secondary | ICD-10-CM | POA: Diagnosis not present

## 2021-01-12 ENCOUNTER — Encounter (INDEPENDENT_AMBULATORY_CARE_PROVIDER_SITE_OTHER): Payer: Self-pay | Admitting: Bariatrics

## 2021-01-12 NOTE — Progress Notes (Signed)
Chief Complaint:   OBESITY Sarah Levy is here to discuss her progress with her obesity treatment plan along with follow-up of her obesity related diagnoses. Sarah Levy is on the Category 2 Plan and states she is following her eating plan approximately 90% of the time. Sarah Levy states she is not exercising at this time.   Today's visit was #: 3 Starting weight: 205 lbs Starting date: 12/10/2020 Today's weight: 201 lbs Today's date: 01/07/2021 Total lbs lost to date: 4 lbs Total lbs lost since last in-office visit: 4 lbs  Interim History: Sarah Levy is down 4 lbs since her last visit. She struggles with getting more protein.  Subjective:   1. Insulin resistance Sarah Levy is not on medications.  2. Elevated cholesterol Sarah Levy is not on medications.  3. Vitamin D deficiency Sarah Levy denies muscle weakness, nausea and vomiting.   Assessment/Plan:   1. Insulin resistance Sarah Levy will decrease carbohydrates. She will increase exercise.  2. Elevated cholesterol Sarah Levy will continue to eat the right balance of healthy fats (PUFAs and MUFAs).  3. Vitamin D deficiency Sarah Levy contributes to fatigue and are associated with obesity, breast, and colon cancer. We will refill Vitamin D today for 1 month with no refills. Sarah Levy agrees to continue to take prescription Vitamin D 50,000 IU every week and will follow-up for routine testing of Vitamin D, at least 2-3 times per year to avoid over-replacement.  - Vitamin D, Ergocalciferol, (DRISDOL) 1.25 MG (50000 UNIT) CAPS capsule; Take 1 capsule (50,000 Units total) by mouth every 7 (seven) days.  Dispense: 4 capsule; Refill: 0   5. Obesity, current BMI 34.6 Sarah Levy is currently in the action stage of change. As such, her goal is to continue with weight loss efforts. She has agreed to the Category 2 Plan.   Sarah Levy will bempre  adherent to the plan. She will continue mindful eating. She will increase H2O.   Exercise goals: Sarah Levy  will do "Walk away the pounds by Sarah Levy".  Behavioral modification strategies: increasing lean protein intake, decreasing simple carbohydrates, increasing vegetables, increasing water intake, decreasing eating out, no skipping meals, meal planning and cooking strategies, keeping healthy foods in the home, and planning for success.  Sarah Levy has agreed to follow-up with our clinic in 2 weeks. She was informed of the importance of frequent follow-up visits to maximize her success with intensive lifestyle modifications for her multiple health conditions.   Objective:   Blood pressure 135/82, pulse 75, temperature 98.1 F (36.7 C), height 5\' 4"  (1.626 m), weight 201 lb (91.2 kg), SpO2 98 %. Body mass index is 34.5 kg/m.  General: Cooperative, alert, well developed, in no acute distress. HEENT: Conjunctivae and lids unremarkable. Cardiovascular: Regular rhythm.  Lungs: Normal work of breathing. Neurologic: No focal deficits.   No results found for: CREATININE, BUN, NA, K, CL, CO2 No results found for: ALT, AST, GGT, ALKPHOS, BILITOT Lab Results  Component Value Date   HGBA1C 5.6 11/26/2020   Lab Results  Component Value Date   INSULIN 16.6 12/10/2020   Lab Results  Component Value Date   TSH 3.69 11/26/2020   Lab Results  Component Value Date   CHOL 225 (H) 12/10/2020   HDL 38 (L) 12/10/2020   LDLCALC 155 (H) 12/10/2020   TRIG 176 (H) 12/10/2020   Lab Results  Component Value Date   VD25OH 18.9 (L) 12/10/2020   No results found for: WBC, HGB, HCT, MCV, PLT No results found for: IRON, TIBC, FERRITIN   Attestation  Statements:   Reviewed by clinician on day of visit: allergies, medications, problem list, medical history, surgical history, family history, social history, and previous encounter notes.  I, Jackson Latino, RMA, am acting as Energy manager for [employer], DO.   I have reviewed the above documentation for accuracy and completeness, and I agree with the above.  Corinna Capra, DO

## 2021-01-14 DIAGNOSIS — Z01419 Encounter for gynecological examination (general) (routine) without abnormal findings: Secondary | ICD-10-CM | POA: Diagnosis not present

## 2021-01-14 DIAGNOSIS — Z113 Encounter for screening for infections with a predominantly sexual mode of transmission: Secondary | ICD-10-CM | POA: Diagnosis not present

## 2021-01-14 DIAGNOSIS — Z124 Encounter for screening for malignant neoplasm of cervix: Secondary | ICD-10-CM | POA: Diagnosis not present

## 2021-01-14 DIAGNOSIS — Z6835 Body mass index (BMI) 35.0-35.9, adult: Secondary | ICD-10-CM | POA: Diagnosis not present

## 2021-01-14 DIAGNOSIS — Z01411 Encounter for gynecological examination (general) (routine) with abnormal findings: Secondary | ICD-10-CM | POA: Diagnosis not present

## 2021-01-21 ENCOUNTER — Encounter (INDEPENDENT_AMBULATORY_CARE_PROVIDER_SITE_OTHER): Payer: Self-pay | Admitting: Bariatrics

## 2021-01-21 ENCOUNTER — Other Ambulatory Visit: Payer: Self-pay

## 2021-01-21 ENCOUNTER — Ambulatory Visit (INDEPENDENT_AMBULATORY_CARE_PROVIDER_SITE_OTHER): Payer: BC Managed Care – PPO | Admitting: Bariatrics

## 2021-01-21 VITALS — BP 135/86 | HR 75 | Temp 98.3°F | Ht 64.0 in | Wt 203.0 lb

## 2021-01-21 DIAGNOSIS — Z6835 Body mass index (BMI) 35.0-35.9, adult: Secondary | ICD-10-CM | POA: Diagnosis not present

## 2021-01-21 DIAGNOSIS — E038 Other specified hypothyroidism: Secondary | ICD-10-CM

## 2021-01-21 NOTE — Progress Notes (Signed)
Chief Complaint:   OBESITY Sarah Levy is here to discuss her progress with her obesity treatment plan along with follow-up of her obesity related diagnoses. Sarah Levy is on the Category 2 Plan and states she is following her eating plan approximately 85% of the time. Sarah Levy states she is doing 0 minutes 0 times per week.  Today's visit was #: 4 Starting weight: 205 lbs Starting date: 12/10/2020 Today's weight: 203 lbs Today's date: 01/21/2021 Total lbs lost to date: 2 Total lbs lost since last in-office visit: 0  Interim History: Sarah Levy is up 2 lbs since her last visit. She followed the plan well with breakfast and lunch, but she struggled for dinner.  Subjective:   1. Other specified hypothyroidism Sarah Levy is taking Synthroid, and her provider increased her dose of synthroid due to patient considering invitro.  Assessment/Plan:   1. Other specified hypothyroidism Sarah Levy will continue her medications. Orders and follow up as documented in patient record.  Counseling Good thyroid control is important for overall health. Supratherapeutic thyroid levels are dangerous and will not improve weight loss results. Counseling: The correct way to take levothyroxine is fasting, with water, separated by at least 30 minutes from breakfast, and separated by more than 4 hours from calcium, iron, multivitamins, acid reflux medications (PPIs).   2. Obesity, current BMI 34.9 Sarah Levy is currently in the action stage of change. As such, her goal is to continue with weight loss efforts. She has agreed to the Category 2 Plan.   Mindful eating was discussed, and we discussed strategies on increasing protein.  Exercise goals: Be more active.  Behavioral modification strategies: increasing lean protein intake, decreasing simple carbohydrates, increasing vegetables, increasing water intake, decreasing eating out, no skipping meals, meal planning and cooking strategies, keeping healthy foods in the home,  and planning for success.  Sarah Levy has agreed to follow-up with our clinic in 3 to 4 weeks. She was informed of the importance of frequent follow-up visits to maximize her success with intensive lifestyle modifications for her multiple health conditions.   Objective:   Blood pressure 135/86, pulse 75, temperature 98.3 F (36.8 C), height 5\' 4"  (1.626 m), weight 203 lb (92.1 kg), SpO2 98 %. Body mass index is 34.84 kg/m.  General: Cooperative, alert, well developed, in no acute distress. HEENT: Conjunctivae and lids unremarkable. Cardiovascular: Regular rhythm.  Lungs: Normal work of breathing. Neurologic: No focal deficits.   No results found for: CREATININE, BUN, NA, K, CL, CO2 No results found for: ALT, AST, GGT, ALKPHOS, BILITOT Lab Results  Component Value Date   HGBA1C 5.6 11/26/2020   Lab Results  Component Value Date   INSULIN 16.6 12/10/2020   Lab Results  Component Value Date   TSH 3.69 11/26/2020   Lab Results  Component Value Date   CHOL 225 (H) 12/10/2020   HDL 38 (L) 12/10/2020   LDLCALC 155 (H) 12/10/2020   TRIG 176 (H) 12/10/2020   Lab Results  Component Value Date   VD25OH 18.9 (L) 12/10/2020   No results found for: WBC, HGB, HCT, MCV, PLT No results found for: IRON, TIBC, FERRITIN  Attestation Statements:   Reviewed by clinician on day of visit: allergies, medications, problem list, medical history, surgical history, family history, social history, and previous encounter notes.   12/12/2020, am acting as Trude Mcburney for Energy manager, DO.  I have reviewed the above documentation for accuracy and completeness, and I agree with the above. [employer], DO

## 2021-01-22 ENCOUNTER — Encounter (INDEPENDENT_AMBULATORY_CARE_PROVIDER_SITE_OTHER): Payer: Self-pay | Admitting: Bariatrics

## 2021-02-06 ENCOUNTER — Other Ambulatory Visit (INDEPENDENT_AMBULATORY_CARE_PROVIDER_SITE_OTHER): Payer: Self-pay | Admitting: Bariatrics

## 2021-02-06 DIAGNOSIS — E559 Vitamin D deficiency, unspecified: Secondary | ICD-10-CM

## 2021-02-11 ENCOUNTER — Ambulatory Visit (INDEPENDENT_AMBULATORY_CARE_PROVIDER_SITE_OTHER): Payer: BC Managed Care – PPO | Admitting: Bariatrics

## 2021-03-04 DIAGNOSIS — R87612 Low grade squamous intraepithelial lesion on cytologic smear of cervix (LGSIL): Secondary | ICD-10-CM | POA: Diagnosis not present

## 2021-03-05 DIAGNOSIS — Z3189 Encounter for other procreative management: Secondary | ICD-10-CM | POA: Diagnosis not present

## 2021-03-07 DIAGNOSIS — Z3189 Encounter for other procreative management: Secondary | ICD-10-CM | POA: Diagnosis not present

## 2021-04-02 DIAGNOSIS — D559 Anemia due to enzyme disorder, unspecified: Secondary | ICD-10-CM | POA: Diagnosis not present

## 2021-04-02 DIAGNOSIS — Z3189 Encounter for other procreative management: Secondary | ICD-10-CM | POA: Diagnosis not present

## 2021-04-06 DIAGNOSIS — Z3189 Encounter for other procreative management: Secondary | ICD-10-CM | POA: Diagnosis not present

## 2021-04-12 DIAGNOSIS — J060 Acute laryngopharyngitis: Secondary | ICD-10-CM | POA: Diagnosis not present

## 2021-04-12 DIAGNOSIS — J0100 Acute maxillary sinusitis, unspecified: Secondary | ICD-10-CM | POA: Diagnosis not present

## 2021-04-12 DIAGNOSIS — J4520 Mild intermittent asthma, uncomplicated: Secondary | ICD-10-CM | POA: Diagnosis not present

## 2021-07-16 DIAGNOSIS — J45901 Unspecified asthma with (acute) exacerbation: Secondary | ICD-10-CM | POA: Diagnosis not present

## 2021-08-05 DIAGNOSIS — M222X1 Patellofemoral disorders, right knee: Secondary | ICD-10-CM | POA: Diagnosis not present

## 2021-08-05 DIAGNOSIS — M222X2 Patellofemoral disorders, left knee: Secondary | ICD-10-CM | POA: Diagnosis not present

## 2021-08-14 DIAGNOSIS — M6281 Muscle weakness (generalized): Secondary | ICD-10-CM | POA: Diagnosis not present

## 2021-08-14 DIAGNOSIS — M222X2 Patellofemoral disorders, left knee: Secondary | ICD-10-CM | POA: Diagnosis not present

## 2021-08-14 DIAGNOSIS — M222X1 Patellofemoral disorders, right knee: Secondary | ICD-10-CM | POA: Diagnosis not present

## 2021-08-26 DIAGNOSIS — M222X1 Patellofemoral disorders, right knee: Secondary | ICD-10-CM | POA: Diagnosis not present

## 2021-08-26 DIAGNOSIS — M6281 Muscle weakness (generalized): Secondary | ICD-10-CM | POA: Diagnosis not present

## 2021-08-26 DIAGNOSIS — M222X2 Patellofemoral disorders, left knee: Secondary | ICD-10-CM | POA: Diagnosis not present

## 2021-09-09 DIAGNOSIS — M222X1 Patellofemoral disorders, right knee: Secondary | ICD-10-CM | POA: Diagnosis not present

## 2021-09-09 DIAGNOSIS — M222X2 Patellofemoral disorders, left knee: Secondary | ICD-10-CM | POA: Diagnosis not present

## 2021-09-30 DIAGNOSIS — M2012 Hallux valgus (acquired), left foot: Secondary | ICD-10-CM | POA: Diagnosis not present

## 2021-09-30 DIAGNOSIS — M2011 Hallux valgus (acquired), right foot: Secondary | ICD-10-CM | POA: Diagnosis not present

## 2021-10-04 DIAGNOSIS — K047 Periapical abscess without sinus: Secondary | ICD-10-CM | POA: Diagnosis not present

## 2022-01-20 ENCOUNTER — Encounter (INDEPENDENT_AMBULATORY_CARE_PROVIDER_SITE_OTHER): Payer: Self-pay

## 2022-03-10 DIAGNOSIS — E559 Vitamin D deficiency, unspecified: Secondary | ICD-10-CM | POA: Diagnosis not present

## 2022-03-10 DIAGNOSIS — Z3183 Encounter for assisted reproductive fertility procedure cycle: Secondary | ICD-10-CM | POA: Diagnosis not present

## 2022-03-10 DIAGNOSIS — Z3189 Encounter for other procreative management: Secondary | ICD-10-CM | POA: Diagnosis not present

## 2022-03-10 DIAGNOSIS — Z319 Encounter for procreative management, unspecified: Secondary | ICD-10-CM | POA: Diagnosis not present

## 2022-03-16 DIAGNOSIS — Z319 Encounter for procreative management, unspecified: Secondary | ICD-10-CM | POA: Diagnosis not present

## 2022-03-16 DIAGNOSIS — E559 Vitamin D deficiency, unspecified: Secondary | ICD-10-CM | POA: Diagnosis not present

## 2022-03-17 DIAGNOSIS — Z319 Encounter for procreative management, unspecified: Secondary | ICD-10-CM | POA: Diagnosis not present

## 2022-03-17 DIAGNOSIS — Z3183 Encounter for assisted reproductive fertility procedure cycle: Secondary | ICD-10-CM | POA: Diagnosis not present

## 2022-03-20 DIAGNOSIS — Z3189 Encounter for other procreative management: Secondary | ICD-10-CM | POA: Diagnosis not present

## 2022-04-02 DIAGNOSIS — Z3200 Encounter for pregnancy test, result unknown: Secondary | ICD-10-CM | POA: Diagnosis not present

## 2022-04-05 DIAGNOSIS — Z3200 Encounter for pregnancy test, result unknown: Secondary | ICD-10-CM | POA: Diagnosis not present

## 2022-04-16 DIAGNOSIS — Z3201 Encounter for pregnancy test, result positive: Secondary | ICD-10-CM | POA: Diagnosis not present

## 2022-05-01 DIAGNOSIS — J069 Acute upper respiratory infection, unspecified: Secondary | ICD-10-CM | POA: Diagnosis not present

## 2022-05-03 DIAGNOSIS — O0900 Supervision of pregnancy with history of infertility, unspecified trimester: Secondary | ICD-10-CM | POA: Diagnosis not present

## 2022-05-19 DIAGNOSIS — O99283 Endocrine, nutritional and metabolic diseases complicating pregnancy, third trimester: Secondary | ICD-10-CM | POA: Diagnosis not present

## 2022-05-19 DIAGNOSIS — O99281 Endocrine, nutritional and metabolic diseases complicating pregnancy, first trimester: Secondary | ICD-10-CM | POA: Diagnosis not present

## 2022-05-19 DIAGNOSIS — Z3A10 10 weeks gestation of pregnancy: Secondary | ICD-10-CM | POA: Diagnosis not present

## 2022-05-19 DIAGNOSIS — Z3481 Encounter for supervision of other normal pregnancy, first trimester: Secondary | ICD-10-CM | POA: Diagnosis not present

## 2022-05-19 DIAGNOSIS — O09511 Supervision of elderly primigravida, first trimester: Secondary | ICD-10-CM | POA: Diagnosis not present

## 2022-05-19 DIAGNOSIS — Z36 Encounter for antenatal screening for chromosomal anomalies: Secondary | ICD-10-CM | POA: Diagnosis not present

## 2022-05-19 DIAGNOSIS — Z3689 Encounter for other specified antenatal screening: Secondary | ICD-10-CM | POA: Diagnosis not present

## 2022-05-21 DIAGNOSIS — J09X2 Influenza due to identified novel influenza A virus with other respiratory manifestations: Secondary | ICD-10-CM | POA: Diagnosis not present

## 2022-06-02 DIAGNOSIS — J0110 Acute frontal sinusitis, unspecified: Secondary | ICD-10-CM | POA: Diagnosis not present

## 2022-06-02 DIAGNOSIS — J060 Acute laryngopharyngitis: Secondary | ICD-10-CM | POA: Diagnosis not present

## 2022-06-02 DIAGNOSIS — J4520 Mild intermittent asthma, uncomplicated: Secondary | ICD-10-CM | POA: Diagnosis not present

## 2022-06-09 DIAGNOSIS — Z3A13 13 weeks gestation of pregnancy: Secondary | ICD-10-CM | POA: Diagnosis not present

## 2022-06-09 DIAGNOSIS — O09511 Supervision of elderly primigravida, first trimester: Secondary | ICD-10-CM | POA: Diagnosis not present

## 2022-06-28 DIAGNOSIS — O09522 Supervision of elderly multigravida, second trimester: Secondary | ICD-10-CM | POA: Diagnosis not present

## 2022-06-28 DIAGNOSIS — Z3A16 16 weeks gestation of pregnancy: Secondary | ICD-10-CM | POA: Diagnosis not present

## 2022-06-28 DIAGNOSIS — O99282 Endocrine, nutritional and metabolic diseases complicating pregnancy, second trimester: Secondary | ICD-10-CM | POA: Diagnosis not present

## 2022-06-28 DIAGNOSIS — Z361 Encounter for antenatal screening for raised alphafetoprotein level: Secondary | ICD-10-CM | POA: Diagnosis not present

## 2022-07-21 DIAGNOSIS — O09512 Supervision of elderly primigravida, second trimester: Secondary | ICD-10-CM | POA: Diagnosis not present

## 2022-07-21 DIAGNOSIS — Z3A19 19 weeks gestation of pregnancy: Secondary | ICD-10-CM | POA: Diagnosis not present

## 2022-08-04 DIAGNOSIS — Z362 Encounter for other antenatal screening follow-up: Secondary | ICD-10-CM | POA: Diagnosis not present

## 2022-08-09 DIAGNOSIS — R052 Subacute cough: Secondary | ICD-10-CM | POA: Diagnosis not present

## 2022-09-15 DIAGNOSIS — Z3689 Encounter for other specified antenatal screening: Secondary | ICD-10-CM | POA: Diagnosis not present

## 2022-09-16 DIAGNOSIS — R030 Elevated blood-pressure reading, without diagnosis of hypertension: Secondary | ICD-10-CM | POA: Diagnosis not present

## 2022-09-20 ENCOUNTER — Observation Stay (HOSPITAL_COMMUNITY)
Admission: AD | Admit: 2022-09-20 | Discharge: 2022-09-22 | Disposition: A | Discharge status: Home / Self Care | Payer: BC Managed Care – PPO | Attending: Obstetrics and Gynecology | Admitting: Obstetrics and Gynecology

## 2022-09-20 ENCOUNTER — Other Ambulatory Visit: Payer: Self-pay

## 2022-09-20 ENCOUNTER — Encounter (HOSPITAL_COMMUNITY): Payer: Self-pay | Admitting: *Deleted

## 2022-09-20 DIAGNOSIS — Z3A28 28 weeks gestation of pregnancy: Secondary | ICD-10-CM | POA: Diagnosis not present

## 2022-09-20 DIAGNOSIS — E876 Hypokalemia: Secondary | ICD-10-CM | POA: Diagnosis not present

## 2022-09-20 DIAGNOSIS — O99513 Diseases of the respiratory system complicating pregnancy, third trimester: Secondary | ICD-10-CM | POA: Insufficient documentation

## 2022-09-20 DIAGNOSIS — O99283 Endocrine, nutritional and metabolic diseases complicating pregnancy, third trimester: Principal | ICD-10-CM | POA: Insufficient documentation

## 2022-09-20 DIAGNOSIS — O10013 Pre-existing essential hypertension complicating pregnancy, third trimester: Secondary | ICD-10-CM | POA: Diagnosis not present

## 2022-09-20 DIAGNOSIS — O368130 Decreased fetal movements, third trimester, not applicable or unspecified: Secondary | ICD-10-CM | POA: Diagnosis not present

## 2022-09-20 DIAGNOSIS — O99282 Endocrine, nutritional and metabolic diseases complicating pregnancy, second trimester: Secondary | ICD-10-CM | POA: Diagnosis not present

## 2022-09-20 DIAGNOSIS — E039 Hypothyroidism, unspecified: Secondary | ICD-10-CM | POA: Insufficient documentation

## 2022-09-20 DIAGNOSIS — J45909 Unspecified asthma, uncomplicated: Secondary | ICD-10-CM | POA: Diagnosis not present

## 2022-09-20 LAB — COMPREHENSIVE METABOLIC PANEL
ALT: 24 U/L (ref 0–44)
AST: 32 U/L (ref 15–41)
Albumin: 2.6 g/dL — ABNORMAL LOW (ref 3.5–5.0)
Alkaline Phosphatase: 82 U/L (ref 38–126)
Anion gap: 11 (ref 5–15)
BUN: <5 mg/dL — ABNORMAL LOW (ref 6–20)
CO2: 24 mmol/L (ref 22–32)
Calcium: 8.6 mg/dL — ABNORMAL LOW (ref 8.9–10.3)
Chloride: 102 mmol/L (ref 98–111)
Creatinine, Ser: 0.59 mg/dL (ref 0.44–1.00)
GFR, Estimated: >60 mL/min (ref >60)
Glucose, Bld: 87 mg/dL (ref 70–99)
Potassium: 2.3 mmol/L — CL (ref 3.5–5.1)
Sodium: 137 mmol/L (ref 135–145)
Total Bilirubin: 0.4 mg/dL (ref 0.3–1.2)
Total Protein: 6.3 g/dL — ABNORMAL LOW (ref 6.5–8.1)

## 2022-09-20 LAB — CBC
HCT: 30.1 % — ABNORMAL LOW (ref 36.0–46.0)
Hemoglobin: 10.3 g/dL — ABNORMAL LOW (ref 12.0–15.0)
MCH: 28.6 pg (ref 26.0–34.0)
MCHC: 34.2 g/dL (ref 30.0–36.0)
MCV: 83.6 fL (ref 80.0–100.0)
Platelets: 437 10*3/uL — ABNORMAL HIGH (ref 150–400)
RBC: 3.6 MIL/uL — ABNORMAL LOW (ref 3.87–5.11)
RDW: 14.4 % (ref 11.5–15.5)
WBC: 13.5 10*3/uL — ABNORMAL HIGH (ref 4.0–10.5)
nRBC: 0 % (ref 0.0–0.2)

## 2022-09-20 LAB — TYPE AND SCREEN
ABO/RH(D): B NEG
Antibody Screen: NEGATIVE

## 2022-09-20 LAB — PROTEIN / CREATININE RATIO, URINE
Creatinine, Urine: 73 mg/dL
Protein Creatinine Ratio: 0.25 mg/mg{Cre} — ABNORMAL HIGH (ref 0.00–0.15)
Total Protein, Urine: 18 mg/dL

## 2022-09-20 LAB — MAGNESIUM: Magnesium: 1.4 mg/dL — ABNORMAL LOW (ref 1.7–2.4)

## 2022-09-20 MED ORDER — POTASSIUM CHLORIDE CRYS ER 20 MEQ PO TBCR
40.0000 meq | EXTENDED_RELEASE_TABLET | Freq: Three times a day (TID) | ORAL | Status: DC
  Administered 2022-09-20 – 2022-09-22 (×5): 40 meq via ORAL
  Filled 2022-09-20 (×5): qty 2

## 2022-09-20 MED ORDER — LABETALOL HCL 100 MG PO TABS: 300.0000 mg | ORAL_TABLET | Freq: Two times a day (BID) | ORAL | Status: DC

## 2022-09-20 MED ORDER — LABETALOL HCL 5 MG/ML IV SOLN
20.0000 mg | INTRAVENOUS | Status: DC | PRN
  Administered 2022-09-20 (×2): 20 mg via INTRAVENOUS
  Filled 2022-09-20 (×2): qty 4

## 2022-09-20 MED ORDER — LABETALOL HCL 5 MG/ML IV SOLN: 80.0000 mg | INTRAVENOUS | Status: DC | PRN

## 2022-09-20 MED ORDER — LACTATED RINGERS IV SOLN: INTRAVENOUS | Status: DC

## 2022-09-20 MED ORDER — LABETALOL HCL 5 MG/ML IV SOLN
40.0000 mg | INTRAVENOUS | Status: DC | PRN
  Administered 2022-09-20: 40 mg via INTRAVENOUS
  Filled 2022-09-20: qty 8

## 2022-09-20 MED ORDER — LABETALOL HCL 200 MG PO TABS
300.0000 mg | ORAL_TABLET | Freq: Two times a day (BID) | ORAL | Status: DC
  Administered 2022-09-20 – 2022-09-22 (×4): 300 mg via ORAL
  Filled 2022-09-20: qty 1
  Filled 2022-09-20: qty 3
  Filled 2022-09-20 (×2): qty 1

## 2022-09-20 MED ORDER — HYDRALAZINE HCL 20 MG/ML IJ SOLN: 10.0000 mg | INTRAMUSCULAR | Status: DC | PRN

## 2022-09-20 MED ORDER — ZOLPIDEM TARTRATE 5 MG PO TABS: 5.0000 mg | ORAL_TABLET | Freq: Every evening | ORAL | Status: DC | PRN

## 2022-09-20 MED ORDER — POTASSIUM CHLORIDE 10 MEQ/100ML IV SOLN
10.0000 meq | INTRAVENOUS | Status: AC
  Administered 2022-09-20 – 2022-09-21 (×4): 10 meq via INTRAVENOUS
  Filled 2022-09-20 (×7): qty 100

## 2022-09-20 NOTE — MAU Note (Signed)
Was at office with Bp's 160/90's, sent here

## 2022-09-20 NOTE — MAU Provider Note (Signed)
History     CSN: [id number]  Arrival date and time: 09/20/22 1630   None     Chief Complaint  Patient presents with   Hypertension   HPI Is a 37 year old G1, P0 at 28 weeks and 2 days who was sent over from the office for elevated blood pressures.  At her last appointment, her blood pressure was mildly elevated and her OB  doctor put her on labetalol 100 mg twice a day, which she has not started yet.  Today, she was at the great [facility] [employer] and she had elevated blood pressures into the 160s.  She drove back home, went to the office, and had her blood pressure taken again which was still severe range.  Due to this, she was sent here to the office for evaluation.  She denies headaches, belly pain, blurred vision.  Fetal movement is a little decreased.  No cramping or contractions  OB History     Gravida  1   Para  0   Term  0   Preterm  0   AB  0   Living  0      SAB  0   IAB  0   Ectopic  0   Multiple  0   Live Births  0           Past Medical History:  Diagnosis Date   Acid reflux    Allergies    Asthma    Back pain    Hypothyroidism    Joint pain    SOB (shortness of breath)     Past Surgical History:  Procedure Laterality Date   WISDOM TOOTH EXTRACTION      Family History  Problem Relation Age of Onset   High Cholesterol Mother    Diabetes Father    High blood pressure Father    High Cholesterol Father    Heart disease Father    Sudden death Father     Social History   Tobacco Use   Smoking status: Never   Smokeless tobacco: Never    Allergies:  Allergies  Allergen Reactions   Sulfa Antibiotics     Feels like throat is closing   Tylenol [Acetaminophen]     Itchy throat, feels like it is closing    Medications Prior to Admission  Medication Sig Dispense Refill Last Dose   cetirizine (ZYRTEC) 10 MG tablet Take 10 mg by mouth daily.   09/20/2022   labetalol (NORMODYNE) 100 MG tablet Take 100 mg by mouth 2 (two) times daily.    09/20/2022 at 0900   levothyroxine (SYNTHROID) 50 MCG tablet Take 50 mcg by mouth daily before breakfast. Pt is now taking 88 mcg qd   09/20/2022   pantoprazole (PROTONIX) 40 MG tablet Take 40 mg by mouth 2 (two) times daily.   09/20/2022   Vitamin D, Ergocalciferol, (DRISDOL) 1.25 MG (50000 UNIT) CAPS capsule Take 1 capsule (50,000 Units total) by mouth every 7 (seven) days. 4 capsule 0 09/20/2022   Vitamin D, Ergocalciferol, (DRISDOL) 1.25 MG (50000 UNIT) CAPS capsule Take 1 capsule (50,000 Units total) by mouth every 7 (seven) days. 4 capsule 0 09/20/2022   ibuprofen (ADVIL) 200 MG tablet Take 200 mg by mouth every 6 (six) hours as needed.       Review of Systems Physical Exam   Blood pressure (!) 159/92, pulse 78, temperature 98.1 F (36.7 C), temperature source Oral, resp. rate 18, height 5\' 4"  (1.626 m), weight 100.5 kg.  Patient Vitals for the past 24 hrs:  BP Temp Temp src Pulse Resp Height Weight  09/20/22 1653 (!) 159/92 98.1 F (36.7 C) Oral 78 18 5\' 4"  (1.626 m) 100.5 kg  09/20/22 1650 (!) 159/92 -- -- 78 -- -- --    Physical Exam Vitals and nursing note reviewed.  Constitutional:      Appearance: Normal appearance.  HENT:     Head: Normocephalic and atraumatic.  Cardiovascular:     Rate and Rhythm: Normal rate and regular rhythm.     Pulses: Normal pulses.  Pulmonary:     Effort: Pulmonary effort is normal.  Abdominal:     General: Abdomen is flat.     Palpations: Abdomen is soft.  Skin:    General: Skin is warm and dry.     Capillary Refill: Capillary refill takes less than 2 seconds.  Neurological:     General: No focal deficit present.     Mental Status: She is alert.  Psychiatric:        Mood and Affect: Mood normal.        Behavior: Behavior normal.        Thought Content: Thought content normal.        Judgment: Judgment normal.    No results found for this or any previous visit (from the past 24 hour(s)). LFTs normal Platelets normal. Potassium critically  low at 2.3  MAU Course  Procedures NST:  Baseline: 130  Variability: moderate Accelerations: present  Decelerations: none Contractions: none  MDM Discussed patient with Dr Billy Coastaavon. Had a couple severe range BPs, treated with labetalol. PO labetalol started. Started potassium replacement. Recommended admission for potassium replacement. Dr Billy Coastaavon agreeable.  Assessment and Plan  [redacted] weeks gestation GHTN Severe hypokalemia  Admit BP control Potassium replacement Check magnesium.  Levie HeritageJacob J Aadan Chenier 09/20/2022, 5:09 PM

## 2022-09-20 NOTE — H&P (Signed)
Sarah Levy is a 37 y.o. female presenting for 23 hr obs for white coat htn and anxiety with ? Chtn. Asmyptomatic hypokalemia for repletion. Nl nips. Nl CUS. Nl labs 3d ago. No s/s pEC.. OB History     Gravida  1   Para  0   Term  0   Preterm  0   AB  0   Living  0      SAB  0   IAB  0   Ectopic  0   Multiple  0   Live Births  0          Past Medical History:  Diagnosis Date   Acid reflux    Allergies    Asthma    Back pain    Hypothyroidism    Joint pain    SOB (shortness of breath)    Past Surgical History:  Procedure Laterality Date   WISDOM TOOTH EXTRACTION     Family History: family history includes Diabetes in her father; Heart disease in her father; High Cholesterol in her father and mother; High blood pressure in her father; Sudden death in her father. Social History:  reports that she has never smoked. She has never used smokeless tobacco. No history on file for alcohol use and drug use.     Maternal Diabetes: No Genetic Screening: Normal Maternal Ultrasounds/Referrals: Normal Fetal Ultrasounds or other Referrals:  None Maternal Substance Abuse:  No Significant Maternal Medications:  None Significant Maternal Lab Results:  None Number of Prenatal Visits:greater than 3 verified prenatal visits Other Comments:  None  Review of Systems  Constitutional: Negative.   All other systems reviewed and are negative.  Maternal Medical History:  Contractions: Frequency: rare.   Fetal activity: Perceived fetal activity is normal.   Last perceived fetal movement was within the past hour.   Prenatal complications: PIH.   Prenatal Complications - Diabetes: none.     Blood pressure (!) 160/84, pulse 70, temperature 98 F (36.7 C), temperature source Oral, resp. rate 18, height 5\' 4"  (1.626 m), weight 100.5 kg, SpO2 100 %. Maternal Exam:  Uterine Assessment: Contraction strength is mild.  Contraction frequency is rare.  Abdomen: Patient  reports no abdominal tenderness. Introitus: Normal vulva. Normal vagina.  Ferning test: not done.  Nitrazine test: not done. Amniotic fluid character: not assessed. Pelvis: questionable for delivery.     Physical Exam Vitals and nursing note reviewed.  Constitutional:      Appearance: Normal appearance. She is obese.  HENT:     Head: Normocephalic and atraumatic.  Cardiovascular:     Rate and Rhythm: Normal rate and regular rhythm.     Pulses: Normal pulses.     Heart sounds: Normal heart sounds.  Pulmonary:     Effort: Pulmonary effort is normal.     Breath sounds: Normal breath sounds.  Abdominal:     General: Abdomen is flat.     Palpations: Abdomen is soft.  Genitourinary:    General: Normal vulva.  Musculoskeletal:        General: Normal range of motion.     Cervical back: Normal range of motion and neck supple.  Skin:    General: Skin is warm and dry.  Neurological:     General: No focal deficit present.     Mental Status: She is alert and oriented to person, place, and time.  Psychiatric:        Mood and Affect: Mood normal.  Prenatal labs: ABO, Rh: --/--/B NEG (04/08 1830) Antibody: NEG (04/08 1830) Rubella:  imm RPR:   neg HBsAg:   neg HIV:   neg GBS:   na  Assessment/Plan: [redacted]w[redacted]d IUP  CHTN vs White coat htn  Anxiety  AMA Abnl 1gtt Hypokalemia of ? Etx K repletion PO labetalol 23 h obs Emilia Kayes J 09/20/2022, 8:59 PM

## 2022-09-21 DIAGNOSIS — O99283 Endocrine, nutritional and metabolic diseases complicating pregnancy, third trimester: Secondary | ICD-10-CM | POA: Diagnosis not present

## 2022-09-21 DIAGNOSIS — Z3A28 28 weeks gestation of pregnancy: Secondary | ICD-10-CM | POA: Diagnosis not present

## 2022-09-21 DIAGNOSIS — O10013 Pre-existing essential hypertension complicating pregnancy, third trimester: Secondary | ICD-10-CM | POA: Diagnosis not present

## 2022-09-21 DIAGNOSIS — E876 Hypokalemia: Secondary | ICD-10-CM | POA: Diagnosis not present

## 2022-09-21 DIAGNOSIS — O99282 Endocrine, nutritional and metabolic diseases complicating pregnancy, second trimester: Secondary | ICD-10-CM | POA: Diagnosis not present

## 2022-09-21 DIAGNOSIS — E039 Hypothyroidism, unspecified: Secondary | ICD-10-CM | POA: Diagnosis not present

## 2022-09-21 DIAGNOSIS — O99513 Diseases of the respiratory system complicating pregnancy, third trimester: Secondary | ICD-10-CM | POA: Diagnosis not present

## 2022-09-21 DIAGNOSIS — J45909 Unspecified asthma, uncomplicated: Secondary | ICD-10-CM | POA: Diagnosis not present

## 2022-09-21 LAB — COMPREHENSIVE METABOLIC PANEL
ALT: 21 U/L (ref 0–44)
ALT: 21 U/L (ref 0–44)
AST: 28 U/L (ref 15–41)
AST: 25 U/L (ref 15–41)
Albumin: 2.3 g/dL — ABNORMAL LOW (ref 3.5–5.0)
Albumin: 2.4 g/dL — ABNORMAL LOW (ref 3.5–5.0)
Alkaline Phosphatase: 76 U/L (ref 38–126)
Alkaline Phosphatase: 76 U/L (ref 38–126)
Anion gap: 7 (ref 5–15)
Anion gap: 10 (ref 5–15)
BUN: <5 mg/dL — ABNORMAL LOW (ref 6–20)
BUN: <5 mg/dL — ABNORMAL LOW (ref 6–20)
CO2: 25 mmol/L (ref 22–32)
CO2: 23 mmol/L (ref 22–32)
Calcium: 8.2 mg/dL — ABNORMAL LOW (ref 8.9–10.3)
Calcium: 8.3 mg/dL — ABNORMAL LOW (ref 8.9–10.3)
Chloride: 106 mmol/L (ref 98–111)
Chloride: 104 mmol/L (ref 98–111)
Creatinine, Ser: 0.65 mg/dL (ref 0.44–1.00)
Creatinine, Ser: 0.59 mg/dL (ref 0.44–1.00)
GFR, Estimated: >60 mL/min (ref >60)
GFR, Estimated: >60 mL/min (ref >60)
Glucose, Bld: 119 mg/dL — ABNORMAL HIGH (ref 70–99)
Glucose, Bld: 97 mg/dL (ref 70–99)
Potassium: 2.5 mmol/L — CL (ref 3.5–5.1)
Potassium: 2.5 mmol/L — CL (ref 3.5–5.1)
Sodium: 138 mmol/L (ref 135–145)
Sodium: 137 mmol/L (ref 135–145)
Total Bilirubin: 0.4 mg/dL (ref 0.3–1.2)
Total Bilirubin: 0.3 mg/dL (ref 0.3–1.2)
Total Protein: 5.8 g/dL — ABNORMAL LOW (ref 6.5–8.1)
Total Protein: 5.7 g/dL — ABNORMAL LOW (ref 6.5–8.1)

## 2022-09-21 LAB — MAGNESIUM
Magnesium: 1.4 mg/dL — ABNORMAL LOW (ref 1.7–2.4)
Magnesium: 1.9 mg/dL (ref 1.7–2.4)

## 2022-09-21 MED ORDER — POTASSIUM CHLORIDE 10 MEQ/100ML IV SOLN
INTRAVENOUS | Status: AC
  Filled 2022-09-21: qty 100

## 2022-09-21 MED ORDER — PRENATAL MULTIVITAMIN CH
1.0000 | ORAL_TABLET | Freq: Every day | ORAL | Status: DC
  Administered 2022-09-21: 1 via ORAL
  Filled 2022-09-21: qty 1

## 2022-09-21 MED ORDER — CALCIUM CARBONATE ANTACID 500 MG PO CHEW
2.0000 | CHEWABLE_TABLET | ORAL | Status: DC | PRN
  Administered 2022-09-22 (×2): 400 mg via ORAL
  Filled 2022-09-21 (×2): qty 2

## 2022-09-21 MED ORDER — POTASSIUM CHLORIDE 10 MEQ/100ML IV SOLN
10.0000 meq | INTRAVENOUS | Status: AC
  Administered 2022-09-21 (×4): 10 meq via INTRAVENOUS
  Filled 2022-09-21 (×4): qty 100

## 2022-09-21 MED ORDER — POTASSIUM CHLORIDE 10 MEQ/100ML IV SOLN: 10.0000 meq | INTRAVENOUS | Status: DC

## 2022-09-21 MED ORDER — MAGNESIUM SULFATE 2 GM/50ML IV SOLN
2.0000 g | Freq: Once | INTRAVENOUS | Status: AC
  Administered 2022-09-21: 2 g via INTRAVENOUS
  Filled 2022-09-21: qty 50

## 2022-09-21 NOTE — Progress Notes (Signed)
Admitted for 23 hr obs No headaches, epigastric pain or visual changes. Good FM Blood pressure (!) 157/78, pulse 78, temperature 97.7 F (36.5 C), temperature source Oral, resp. rate 18, height 5\' 4"  (1.626 m), weight 100.5 kg, SpO2 99 %.  No RUQ tenderness DTRs 1-2+ with tr edema FHR category 1 tracing Magnesium and k repletion ongoing Rpt CMP with mag level pending. DC home pending results.

## 2022-09-22 DIAGNOSIS — J45909 Unspecified asthma, uncomplicated: Secondary | ICD-10-CM | POA: Diagnosis not present

## 2022-09-22 DIAGNOSIS — O99282 Endocrine, nutritional and metabolic diseases complicating pregnancy, second trimester: Secondary | ICD-10-CM | POA: Diagnosis not present

## 2022-09-22 DIAGNOSIS — E876 Hypokalemia: Secondary | ICD-10-CM | POA: Diagnosis not present

## 2022-09-22 DIAGNOSIS — O10013 Pre-existing essential hypertension complicating pregnancy, third trimester: Secondary | ICD-10-CM | POA: Diagnosis not present

## 2022-09-22 DIAGNOSIS — E039 Hypothyroidism, unspecified: Secondary | ICD-10-CM | POA: Diagnosis not present

## 2022-09-22 DIAGNOSIS — O99513 Diseases of the respiratory system complicating pregnancy, third trimester: Secondary | ICD-10-CM | POA: Diagnosis not present

## 2022-09-22 DIAGNOSIS — Z3A28 28 weeks gestation of pregnancy: Secondary | ICD-10-CM | POA: Diagnosis not present

## 2022-09-22 DIAGNOSIS — O99283 Endocrine, nutritional and metabolic diseases complicating pregnancy, third trimester: Secondary | ICD-10-CM | POA: Diagnosis not present

## 2022-09-22 LAB — COMPREHENSIVE METABOLIC PANEL
ALT: 22 U/L (ref 0–44)
AST: 27 U/L (ref 15–41)
Albumin: 2.3 g/dL — ABNORMAL LOW (ref 3.5–5.0)
Alkaline Phosphatase: 75 U/L (ref 38–126)
Anion gap: 8 (ref 5–15)
BUN: <5 mg/dL — ABNORMAL LOW (ref 6–20)
CO2: 23 mmol/L (ref 22–32)
Calcium: 8.8 mg/dL — ABNORMAL LOW (ref 8.9–10.3)
Chloride: 106 mmol/L (ref 98–111)
Creatinine, Ser: 0.62 mg/dL (ref 0.44–1.00)
GFR, Estimated: >60 mL/min (ref >60)
Glucose, Bld: 93 mg/dL (ref 70–99)
Potassium: 2.8 mmol/L — ABNORMAL LOW (ref 3.5–5.1)
Sodium: 137 mmol/L (ref 135–145)
Total Bilirubin: 0.4 mg/dL (ref 0.3–1.2)
Total Protein: 5.5 g/dL — ABNORMAL LOW (ref 6.5–8.1)

## 2022-09-22 LAB — MAGNESIUM: Magnesium: 1.5 mg/dL — ABNORMAL LOW (ref 1.7–2.4)

## 2022-09-22 MED ORDER — MAGNESIUM SULFATE 2 GM/50ML IV SOLN
2.0000 g | Freq: Once | INTRAVENOUS | Status: AC
  Administered 2022-09-22: 2 g via INTRAVENOUS
  Filled 2022-09-22: qty 50

## 2022-09-22 MED ORDER — POTASSIUM CHLORIDE 10 MEQ/100ML IV SOLN
10.0000 meq | Freq: Once | INTRAVENOUS | Status: AC
  Administered 2022-09-22: 10 meq via INTRAVENOUS
  Filled 2022-09-22: qty 100

## 2022-09-22 NOTE — Discharge Summary (Signed)
OB Discharge Summary  Patient Name: MAEGHEN GENT DOB: [birth year] MRN: [id number]  Date of admission: 09/20/2022   Admitting diagnosis: Hypokalemia [E87.6] Hypokalemia due to excessive gastrointestinal loss of potassium [E87.6] Intrauterine pregnancy: [redacted]w[redacted]d     Secondary diagnosis: Patient Active Problem List   Diagnosis Date Noted   Hypokalemia due to excessive gastrointestinal loss of potassium 09/21/2022   Hypokalemia 09/20/2022   Low HDL (under 40) 12/24/2020   Insulin resistance 12/24/2020   Elevated cholesterol 12/11/2020   Vitamin D deficiency 12/11/2020   Class 2 obesity due to excess calories without serious comorbidity with body mass index (BMI) of 35.0 to 35.9 in adult 12/11/2020   Asthma without status asthmaticus 11/13/2019   GERD (gastroesophageal reflux disease) 11/13/2019   Additional problems:na   Date of discharge: 09/22/2022   Discharge diagnosis: Principal Problem:   Hypokalemia Active Problems:   Hypokalemia due to excessive gastrointestinal loss of potassium                                                              Hospital course:  Potassium repletion and BP control achieved  Subjective: na Physical exam  Vitals:   09/22/22 0757 09/22/22 0759 09/22/22 0819 09/22/22 1044  BP: (!) 181/83 (!) 171/76 (!) 162/71 (!) 160/73  Pulse: 72 80 79 80  Resp: 18  18   Temp: 97.7 F (36.5 C)     TempSrc: Oral     SpO2: 99%     Weight:      Height:       General: alert, cooperative, and no distress LDVT Evaluation: No evidence of DVT seen on physical exam. Labs: Lab Results  Component Value Date   WBC 13.5 (H) 09/20/2022   HGB 10.3 (L) 09/20/2022   HCT 30.1 (L) 09/20/2022   MCV 83.6 09/20/2022   PLT 437 (H) 09/20/2022      Latest Ref Rng & Units 09/22/2022    5:22 AM  CMP  Glucose 70 - 99 mg/dL 93   BUN 6 - 20 mg/dL <5   Creatinine 4.94 - 1.00 mg/dL 4.96   Sodium 759 - 163 mmol/L 137   Potassium 3.5 - 5.1 mmol/L 2.8   Chloride 98 -  111 mmol/L 106   CO2 22 - 32 mmol/L 23   Calcium 8.9 - 10.3 mg/dL 8.8   Total Protein 6.5 - 8.1 g/dL 5.5   Total Bilirubin 0.3 - 1.2 mg/dL 0.4   Alkaline Phos 38 - 126 U/L 75   AST 15 - 41 U/L 27   ALT 0 - 44 U/L 22        No data to display         Vaccines: TDaP         na         Flu na Discharge instruction:  per After Visit Summary,  [facility] booklet and  "[facility]" hospital booklet After Visit Meds:  Allergies as of 09/22/2022       Reactions   Sulfa Antibiotics    Feels like throat is closing   Tylenol [acetaminophen]    Itchy throat, feels like it is closing        Medication List     STOP taking these medications  cetirizine 10 MG tablet Commonly known as: ZYRTEC   ibuprofen 200 MG tablet Commonly known as: ADVIL   labetalol 100 MG tablet Commonly known as: NORMODYNE   levothyroxine 50 MCG tablet Commonly known as: SYNTHROID   pantoprazole 40 MG tablet Commonly known as: PROTONIX   Vitamin D (Ergocalciferol) 1.25 MG (50000 UNIT) Caps capsule Commonly known as: DRISDOL       Diet: routine diet Activity: Advance as tolerated.  Signed: Lenoard Aden, MD 09/22/2022, 8:33 PM

## 2022-09-22 NOTE — Progress Notes (Signed)
Dr. Billy Coast made aware of patient's BPs of 181/83 and 162/71. Instructed to give PO Labetalol and move forward with discharge. No further orders were given. Carmelina Dane, RN

## 2022-09-22 NOTE — Progress Notes (Signed)
Patient ID: Sarah Levy, female   DOB: [birth year], 37 y.o.   MRN: [id number] HD 2 28+wk IUP HTN/hypokalemia  S: Feels well Good FM , No HAs , No N/V,  No diarrhea No epigastric pain BP (!) 143/82 (BP Location: Right Arm) Comment: 15 min recheck  Pulse 75   Temp 97.7 F (36.5 C) (Oral)   Resp 19   Ht 5\' 4"  (1.626 m)   Wt 100.5 kg   SpO2 98%   BMI 38.04 kg/m   HEENT: nl Neck: supple Lungs: CTA CV: RRR ABD: Gravid , NT No CVAT EXT: 2+ edema, DTR 2+ Neuro:nonfocal Skin : intact     Latest Ref Rng & Units 09/22/2022    5:22 AM 09/21/2022    2:25 PM 09/21/2022    9:08 AM  CMP  Glucose 70 - 99 mg/dL 93  97  315   BUN 6 - 20 mg/dL <5  <5  <5   Creatinine 0.44 - 1.00 mg/dL 9.45  8.59  2.92   Sodium 135 - 145 mmol/L 137  137  138   Potassium 3.5 - 5.1 mmol/L 2.8  2.5  2.5  C  Chloride 98 - 111 mmol/L 106  104  106   CO2 22 - 32 mmol/L 23  23  25    Calcium 8.9 - 10.3 mg/dL 8.8  8.3  8.2   Total Protein 6.5 - 8.1 g/dL 5.5  5.7  5.8   Total Bilirubin 0.3 - 1.2 mg/dL 0.4  0.3  0.4   Alkaline Phos 38 - 126 U/L 75  76  76   AST 15 - 41 U/L 27  25  28    ALT 0 - 44 U/L 22  21  21      C Corrected result   FHR category 1 Reviewed FHR tracings x 3  IMP: HTN- gest vs chronic, white coat- nl labs, asymptomatic Hypokalemia/hypomagnesiumia- repletion ongoing  PO repletion discussed DC home today Fu office one week Labetalol 200mg  tid at fome Short term fu in office PEC precautions  60 minutes lab review, exam and consultation.  Addendum: Marked anxiety this am with IV site pain. Will give PO labetalol now and dc home after K and MG IV doses are complete.

## 2022-09-22 NOTE — Plan of Care (Signed)
Patient discharged home with printed instructions. Nell Gales L Verneal Wiers, RN  

## 2022-09-24 DIAGNOSIS — Z3A28 28 weeks gestation of pregnancy: Secondary | ICD-10-CM | POA: Diagnosis not present

## 2022-09-24 DIAGNOSIS — O99810 Abnormal glucose complicating pregnancy: Secondary | ICD-10-CM | POA: Diagnosis not present

## 2022-09-29 DIAGNOSIS — Z3A29 29 weeks gestation of pregnancy: Secondary | ICD-10-CM | POA: Diagnosis not present

## 2022-09-29 DIAGNOSIS — Z3689 Encounter for other specified antenatal screening: Secondary | ICD-10-CM | POA: Diagnosis not present

## 2022-09-29 DIAGNOSIS — O169 Unspecified maternal hypertension, unspecified trimester: Secondary | ICD-10-CM | POA: Diagnosis not present

## 2022-09-29 DIAGNOSIS — O163 Unspecified maternal hypertension, third trimester: Secondary | ICD-10-CM | POA: Diagnosis not present

## 2022-10-01 ENCOUNTER — Inpatient Hospital Stay (HOSPITAL_COMMUNITY)
Admission: AD | Admit: 2022-10-01 | Discharge: 2022-10-08 | DRG: 788 | Disposition: A | Discharge status: Home / Self Care | Payer: BC Managed Care – PPO | Attending: Obstetrics and Gynecology | Admitting: Obstetrics and Gynecology

## 2022-10-01 ENCOUNTER — Inpatient Hospital Stay (HOSPITAL_BASED_OUTPATIENT_CLINIC_OR_DEPARTMENT_OTHER): Payer: BC Managed Care – PPO

## 2022-10-01 ENCOUNTER — Other Ambulatory Visit: Payer: Self-pay

## 2022-10-01 ENCOUNTER — Encounter (HOSPITAL_COMMUNITY): Payer: Self-pay | Admitting: Obstetrics and Gynecology

## 2022-10-01 DIAGNOSIS — O26899 Other specified pregnancy related conditions, unspecified trimester: Secondary | ICD-10-CM

## 2022-10-01 DIAGNOSIS — O09513 Supervision of elderly primigravida, third trimester: Secondary | ICD-10-CM

## 2022-10-01 DIAGNOSIS — Z6791 Unspecified blood type, Rh negative: Secondary | ICD-10-CM | POA: Diagnosis not present

## 2022-10-01 DIAGNOSIS — E039 Hypothyroidism, unspecified: Secondary | ICD-10-CM | POA: Diagnosis present

## 2022-10-01 DIAGNOSIS — O26893 Other specified pregnancy related conditions, third trimester: Secondary | ICD-10-CM | POA: Diagnosis present

## 2022-10-01 DIAGNOSIS — O1490 Unspecified pre-eclampsia, unspecified trimester: Secondary | ICD-10-CM | POA: Diagnosis present

## 2022-10-01 DIAGNOSIS — F419 Anxiety disorder, unspecified: Secondary | ICD-10-CM | POA: Diagnosis not present

## 2022-10-01 DIAGNOSIS — O99284 Endocrine, nutritional and metabolic diseases complicating childbirth: Secondary | ICD-10-CM | POA: Diagnosis not present

## 2022-10-01 DIAGNOSIS — O99344 Other mental disorders complicating childbirth: Secondary | ICD-10-CM | POA: Diagnosis not present

## 2022-10-01 DIAGNOSIS — D225 Melanocytic nevi of trunk: Secondary | ICD-10-CM | POA: Diagnosis not present

## 2022-10-01 DIAGNOSIS — Z3A30 30 weeks gestation of pregnancy: Secondary | ICD-10-CM | POA: Diagnosis not present

## 2022-10-01 DIAGNOSIS — O1413 Severe pre-eclampsia, third trimester: Secondary | ICD-10-CM

## 2022-10-01 DIAGNOSIS — O9952 Diseases of the respiratory system complicating childbirth: Secondary | ICD-10-CM | POA: Diagnosis not present

## 2022-10-01 DIAGNOSIS — O99214 Obesity complicating childbirth: Secondary | ICD-10-CM | POA: Diagnosis present

## 2022-10-01 DIAGNOSIS — E876 Hypokalemia: Secondary | ICD-10-CM | POA: Diagnosis not present

## 2022-10-01 DIAGNOSIS — O169 Unspecified maternal hypertension, unspecified trimester: Secondary | ICD-10-CM | POA: Diagnosis present

## 2022-10-01 DIAGNOSIS — Z3A29 29 weeks gestation of pregnancy: Secondary | ICD-10-CM

## 2022-10-01 DIAGNOSIS — Z79899 Other long term (current) drug therapy: Secondary | ICD-10-CM

## 2022-10-01 DIAGNOSIS — Z23 Encounter for immunization: Secondary | ICD-10-CM

## 2022-10-01 DIAGNOSIS — O1414 Severe pre-eclampsia complicating childbirth: Principal | ICD-10-CM | POA: Diagnosis present

## 2022-10-01 DIAGNOSIS — Z87891 Personal history of nicotine dependence: Secondary | ICD-10-CM | POA: Diagnosis not present

## 2022-10-01 DIAGNOSIS — O1493 Unspecified pre-eclampsia, third trimester: Secondary | ICD-10-CM | POA: Diagnosis not present

## 2022-10-01 DIAGNOSIS — J45909 Unspecified asthma, uncomplicated: Secondary | ICD-10-CM | POA: Diagnosis not present

## 2022-10-01 DIAGNOSIS — O43813 Placental infarction, third trimester: Secondary | ICD-10-CM | POA: Diagnosis not present

## 2022-10-01 DIAGNOSIS — Z98891 History of uterine scar from previous surgery: Secondary | ICD-10-CM

## 2022-10-01 LAB — TYPE AND SCREEN
ABO/RH(D): B NEG
Antibody Screen: NEGATIVE

## 2022-10-01 LAB — COMPREHENSIVE METABOLIC PANEL
ALT: 23 U/L (ref 0–44)
AST: 24 U/L (ref 15–41)
Albumin: 2.5 g/dL — ABNORMAL LOW (ref 3.5–5.0)
Alkaline Phosphatase: 97 U/L (ref 38–126)
Anion gap: 10 (ref 5–15)
BUN: <5 mg/dL — ABNORMAL LOW (ref 6–20)
CO2: 22 mmol/L (ref 22–32)
Calcium: 8.5 mg/dL — ABNORMAL LOW (ref 8.9–10.3)
Chloride: 104 mmol/L (ref 98–111)
Creatinine, Ser: 0.66 mg/dL (ref 0.44–1.00)
GFR, Estimated: >60 mL/min (ref >60)
Glucose, Bld: 97 mg/dL (ref 70–99)
Potassium: 3 mmol/L — ABNORMAL LOW (ref 3.5–5.1)
Sodium: 136 mmol/L (ref 135–145)
Total Bilirubin: 0.4 mg/dL (ref 0.3–1.2)
Total Protein: 6.2 g/dL — ABNORMAL LOW (ref 6.5–8.1)

## 2022-10-01 LAB — DIFFERENTIAL
Abs Immature Granulocytes: 0.18 10*3/uL — ABNORMAL HIGH (ref 0.00–0.07)
Basophils Absolute: 0.1 10*3/uL (ref 0.0–0.1)
Basophils Relative: 1 %
Eosinophils Absolute: 0.1 10*3/uL (ref 0.0–0.5)
Eosinophils Relative: 1 %
Immature Granulocytes: 1 %
Lymphocytes Relative: 18 %
Lymphs Abs: 2.3 10*3/uL (ref 0.7–4.0)
Monocytes Absolute: 0.7 10*3/uL (ref 0.1–1.0)
Monocytes Relative: 6 %
Neutro Abs: 9.9 10*3/uL — ABNORMAL HIGH (ref 1.7–7.7)
Neutrophils Relative %: 73 %

## 2022-10-01 LAB — CBC
HCT: 34 % — ABNORMAL LOW (ref 36.0–46.0)
Hemoglobin: 10.8 g/dL — ABNORMAL LOW (ref 12.0–15.0)
MCH: 28.1 pg (ref 26.0–34.0)
MCHC: 31.8 g/dL (ref 30.0–36.0)
MCV: 88.3 fL (ref 80.0–100.0)
Platelets: 442 10*3/uL — ABNORMAL HIGH (ref 150–400)
RBC: 3.85 MIL/uL — ABNORMAL LOW (ref 3.87–5.11)
RDW: 14.3 % (ref 11.5–15.5)
WBC: 13.3 10*3/uL — ABNORMAL HIGH (ref 4.0–10.5)
nRBC: 0 % (ref 0.0–0.2)

## 2022-10-01 LAB — TSH: TSH: 3.6 u[IU]/mL (ref 0.350–4.500)

## 2022-10-01 LAB — MAGNESIUM: Magnesium: 1.5 mg/dL — ABNORMAL LOW (ref 1.7–2.4)

## 2022-10-01 MED ORDER — BETAMETHASONE SOD PHOS & ACET 6 (3-3) MG/ML IJ SUSP
12.0000 mg | INTRAMUSCULAR | Status: AC
  Administered 2022-10-01 – 2022-10-02 (×2): 12 mg via INTRAMUSCULAR
  Filled 2022-10-01: qty 5

## 2022-10-01 MED ORDER — LEVOTHYROXINE SODIUM 88 MCG PO TABS
88.0000 ug | ORAL_TABLET | Freq: Every day | ORAL | Status: DC
  Administered 2022-10-02 – 2022-10-08 (×7): 88 ug via ORAL
  Filled 2022-10-01 (×9): qty 1

## 2022-10-01 MED ORDER — MAGNESIUM SULFATE 40 GM/1000ML IV SOLN
2.0000 g/h | INTRAVENOUS | Status: AC
  Administered 2022-10-01 – 2022-10-02 (×2): 2 g/h via INTRAVENOUS
  Filled 2022-10-01 (×2): qty 1000

## 2022-10-01 MED ORDER — PRENATAL MULTIVITAMIN CH
1.0000 | ORAL_TABLET | Freq: Every day | ORAL | Status: DC
  Administered 2022-10-02 – 2022-10-03 (×2): 1 via ORAL
  Filled 2022-10-01 (×3): qty 1

## 2022-10-01 MED ORDER — POTASSIUM CHLORIDE CRYS ER 20 MEQ PO TBCR
40.0000 meq | EXTENDED_RELEASE_TABLET | Freq: Two times a day (BID) | ORAL | Status: DC
  Administered 2022-10-01 – 2022-10-05 (×7): 40 meq via ORAL
  Filled 2022-10-01 (×9): qty 2

## 2022-10-01 MED ORDER — MAGNESIUM 400 MG PO TABS: 400.0000 mg | ORAL_TABLET | Freq: Every day | ORAL | Status: DC

## 2022-10-01 MED ORDER — DOCUSATE SODIUM 100 MG PO CAPS
100.0000 mg | ORAL_CAPSULE | Freq: Every day | ORAL | Status: DC
  Administered 2022-10-02 – 2022-10-03 (×2): 100 mg via ORAL
  Filled 2022-10-01 (×3): qty 1

## 2022-10-01 MED ORDER — CALCIUM CARBONATE ANTACID 500 MG PO CHEW: 2.0000 | CHEWABLE_TABLET | ORAL | Status: DC | PRN

## 2022-10-01 MED ORDER — NIFEDIPINE ER OSMOTIC RELEASE 30 MG PO TB24
30.0000 mg | ORAL_TABLET | Freq: Two times a day (BID) | ORAL | Status: DC
  Administered 2022-10-01 – 2022-10-04 (×8): 30 mg via ORAL
  Filled 2022-10-01 (×8): qty 1

## 2022-10-01 MED ORDER — LABETALOL HCL 5 MG/ML IV SOLN
40.0000 mg | INTRAVENOUS | Status: DC | PRN
  Administered 2022-10-01 – 2022-10-04 (×2): 40 mg via INTRAVENOUS
  Filled 2022-10-01 (×3): qty 8

## 2022-10-01 MED ORDER — POTASSIUM CHLORIDE ER 10 MEQ PO TBCR
40.0000 meq | EXTENDED_RELEASE_TABLET | Freq: Every day | ORAL | Status: DC
  Filled 2022-10-01: qty 4

## 2022-10-01 MED ORDER — HYDRALAZINE HCL 20 MG/ML IJ SOLN
10.0000 mg | INTRAMUSCULAR | Status: DC | PRN
  Administered 2022-10-01 – 2022-10-04 (×2): 10 mg via INTRAVENOUS
  Filled 2022-10-01 (×3): qty 1

## 2022-10-01 MED ORDER — LABETALOL HCL 200 MG PO TABS
600.0000 mg | ORAL_TABLET | Freq: Three times a day (TID) | ORAL | Status: DC
  Administered 2022-10-01 – 2022-10-03 (×7): 600 mg via ORAL
  Filled 2022-10-01 (×7): qty 3

## 2022-10-01 MED ORDER — LABETALOL HCL 5 MG/ML IV SOLN
20.0000 mg | INTRAVENOUS | Status: DC | PRN
  Administered 2022-10-01 – 2022-10-04 (×4): 20 mg via INTRAVENOUS
  Filled 2022-10-01 (×4): qty 4

## 2022-10-01 MED ORDER — ZOLPIDEM TARTRATE 5 MG PO TABS: 5.0000 mg | ORAL_TABLET | Freq: Every evening | ORAL | Status: DC | PRN

## 2022-10-01 MED ORDER — PANTOPRAZOLE SODIUM 40 MG PO TBEC
40.0000 mg | DELAYED_RELEASE_TABLET | Freq: Every day | ORAL | Status: DC
  Administered 2022-10-02 – 2022-10-08 (×7): 40 mg via ORAL
  Filled 2022-10-01 (×9): qty 1

## 2022-10-01 MED ORDER — NIFEDIPINE 10 MG PO CAPS
10.0000 mg | ORAL_CAPSULE | Freq: Once | ORAL | Status: AC
  Administered 2022-10-01: 10 mg via ORAL
  Filled 2022-10-01: qty 1

## 2022-10-01 MED ORDER — MAGNESIUM SULFATE BOLUS VIA INFUSION
4.0000 g | Freq: Once | INTRAVENOUS | Status: AC
  Administered 2022-10-01: 4 g via INTRAVENOUS
  Filled 2022-10-01: qty 1000

## 2022-10-01 MED ORDER — LABETALOL HCL 200 MG PO TABS
400.0000 mg | ORAL_TABLET | Freq: Three times a day (TID) | ORAL | Status: DC
  Administered 2022-10-01: 400 mg via ORAL
  Filled 2022-10-01 (×3): qty 2

## 2022-10-01 MED ORDER — LABETALOL HCL 5 MG/ML IV SOLN
80.0000 mg | INTRAVENOUS | Status: DC | PRN
  Administered 2022-10-01 – 2022-10-04 (×2): 80 mg via INTRAVENOUS
  Filled 2022-10-01 (×2): qty 16

## 2022-10-01 MED ORDER — ALBUTEROL SULFATE (2.5 MG/3ML) 0.083% IN NEBU: 2.5000 mg | INHALATION_SOLUTION | Freq: Four times a day (QID) | RESPIRATORY_TRACT | Status: DC | PRN

## 2022-10-01 MED ORDER — LACTATED RINGERS IV SOLN: 125.0000 mL/h | INTRAVENOUS | Status: AC

## 2022-10-01 MED ORDER — LACTATED RINGERS IV SOLN: INTRAVENOUS | Status: DC

## 2022-10-01 NOTE — Plan of Care (Signed)
  Problem: Education: Goal: Knowledge of General Education information will improve Description: Including pain rating scale, medication(s)/side effects and non-pharmacologic comfort measures Outcome: Progressing   Problem: Health Behavior/Discharge Planning: Goal: Ability to manage health-related needs will improve Outcome: Progressing   Problem: Clinical Measurements: Goal: Ability to maintain clinical measurements within normal limits will improve Outcome: Progressing Goal: Will remain free from infection Outcome: Progressing Goal: Diagnostic test results will improve Outcome: Progressing Goal: Respiratory complications will improve Outcome: Progressing Goal: Cardiovascular complication will be avoided Outcome: Progressing   Problem: Activity: Goal: Risk for activity intolerance will decrease Outcome: Progressing   Problem: Nutrition: Goal: Adequate nutrition will be maintained Outcome: Progressing   Problem: Coping: Goal: Level of anxiety will decrease Outcome: Progressing   Problem: Elimination: Goal: Will not experience complications related to bowel motility Outcome: Progressing Goal: Will not experience complications related to urinary retention Outcome: Progressing   Problem: Pain Managment: Goal: General experience of comfort will improve Outcome: Progressing   Problem: Safety: Goal: Ability to remain free from injury will improve Outcome: Progressing   Problem: Skin Integrity: Goal: Risk for impaired skin integrity will decrease Outcome: Progressing   Problem: Education: Goal: Knowledge of disease or condition will improve Outcome: Progressing Goal: Knowledge of the prescribed therapeutic regimen will improve Outcome: Progressing Goal: Individualized Educational Video(s) Outcome: Progressing   Problem: Clinical Measurements: Goal: Complications related to the disease process, condition or treatment will be avoided or minimized Outcome:  Progressing   Problem: Education: Goal: Knowledge of disease or condition will improve Outcome: Progressing Goal: Knowledge of the prescribed therapeutic regimen will improve Outcome: Progressing   Problem: Fluid Volume: Goal: Peripheral tissue perfusion will improve Outcome: Progressing   Problem: Clinical Measurements: Goal: Complications related to disease process, condition or treatment will be avoided or minimized Outcome: Progressing   

## 2022-10-01 NOTE — Consult Note (Addendum)
MFM TeleConsultation  I connected with  SAPHYRE CILLO on 10/01/22 by a video enabled telemedicine application and verified that I am speaking with the correct person using two identifiers.  Ms. Brinker was in the hospital and Dr. Grace Bushy was at an outpatient office.   I discussed the limitations of evaluation and management by telemedicine. The patient expressed understanding and agreed to proceed. Ms. Payton Emerald was located in the hospital and I was located in our outpatient office.  Mrs. Goto is a 37 yo G1P0 at 22 w 6 d with an EDD of 12/11/22. She is being seen today at the request of Dr. Olivia Mackie due to new onset severe hypertension and new onset elevated UPC of 2.2 (per Dr. Billy Coast- this lab was gathered in his office)  She is overall doing well today without complaints. She denies headache, vaginal bleeding, RUQ, and N/V.  Her prenatal labs have been normal with exception of an abnormal 1hr GTT but normal 3hrGTT.  There has been questions regarding her diagnosis of white coat HTN vs GHTN. She orginally entereed pregnancy with a BP of 140/90 around [redacted] weeks gestation. She had normal blood pressure up until 28 weeks. Where she had a 140/150 then two days later had two blood pressures in the 160's.  She was initially started on Labetalol but has since required and increase to 400 mg BID. Her labs are normal with exception of a change in her upc from  0.2 to no 2.2.   Ms. Gorden confirmed the above history as well as her wife [name].  While being in the hospital she has been given oral procardia and now is on the IV antihypertensive protocol. She has received magnesium sulfate and a first dose of BMZ. Again she denies symptoms with these blood pressures.     10/01/2022    1:00 PM 10/01/2022   12:40 PM 10/01/2022   12:28 PM  Vitals with BMI  Systolic 177 175 409  Diastolic 88 96 85  Pulse 84 83 75   Past Medical History:  Diagnosis Date   Acid reflux    Allergies     Asthma    sports or allergies induced   Back pain    Hypothyroidism    Joint pain    SOB (shortness of breath)    Past Surgical History:  Procedure Laterality Date   WISDOM TOOTH EXTRACTION     Family History  Problem Relation Age of Onset   High Cholesterol Mother    Diabetes Father    High blood pressure Father    High Cholesterol Father    Heart disease Father    Sudden death Father    Social History   Socioeconomic History   Marital status: Married    Spouse name: Nicholos Johns   Number of children: Not on file   Years of education: Not on file   Highest education level: Not on file  Occupational History   Occupation: Designer, multimedia Admin - [employer]  Tobacco Use   Smoking status: Former    Types: Cigarettes    Quit date: 2008    Years since quitting: 16.3   Smokeless tobacco: Never  Vaping Use   Vaping Use: Never used  Substance and Sexual Activity   Alcohol use: Not Currently   Drug use: Never   Sexual activity: Yes  Other Topics Concern   Not on file  Social History Narrative   Not on file   Social Determinants of Health  Financial Resource Strain: Not on file  Food Insecurity: No Food Insecurity (10/01/2022)   Hunger Vital Sign    Worried About Running Out of Food in the Last Year: Never true    Ran Out of Food in the Last Year: Never true  Transportation Needs: No Transportation Needs (10/01/2022)   PRAPARE - Administrator, Civil Service (Medical): No    Lack of Transportation (Non-Medical): No  Physical Activity: Not on file  Stress: Not on file  Social Connections: Not on file  Intimate Partner Violence: Not At Risk (10/01/2022)   Humiliation, Afraid, Rape, and Kick questionnaire    Fear of Current or Ex-Partner: No    Emotionally Abused: No    Physically Abused: No    Sexually Abused: No   OB History  Gravida Para Term Preterm AB Living  1 0 0 0 0 0  SAB IAB Ectopic Multiple Live Births  0 0 0 0 0    #  Outcome Date GA Lbr Len/2nd Weight Sex Delivery Anes PTL Lv  1 Current             Current Facility-Administered Medications (Endocrine & Metabolic):    betamethasone acetate-betamethasone sodium phosphate (CELESTONE) injection 12 mg   Current Facility-Administered Medications (Cardiovascular):    labetalol (NORMODYNE) injection 20 mg **AND** labetalol (NORMODYNE) injection 40 mg **AND** labetalol (NORMODYNE) injection 80 mg **AND** hydrALAZINE (APRESOLINE) injection 10 mg **AND** Measure blood pressure   NIFEdipine (PROCARDIA-XL/NIFEDICAL-XL) 24 hr tablet 30 mg         Current Facility-Administered Medications (Other):    calcium carbonate (TUMS - dosed in mg elemental calcium) chewable tablet 400 mg of elemental calcium   docusate sodium (COLACE) capsule 100 mg   lactated ringers infusion   lactated ringers infusion   magnesium sulfate 40 grams in SWI 1000 mL OB infusion   prenatal multivitamin tablet 1 tablet   zolpidem (AMBIEN) tablet 5 mg  No current outpatient medications on file.  Allergies  Allergen Reactions   Sulfa Antibiotics     Feels like throat is closing   Tylenol [Acetaminophen]     Itchy throat, feels like it is closing   Imaging: Limited fluid exam-pending being performed has had a recent growth with her providers.   Impression/Counseling:   I explained todays findings with Ms. Beissel and her wife [name], in particular we discussed the diagnosis, evaluation and mangement of preeclampsia with and without severe features.   We discussed that given that she has had elevated blood pressures >160/100 new onset proteinuria and requiring treatment that she meets the criteria for preeclampsia with severe features.   We discussed the complications associated with this condition to include maternal stroke, cesarean delivery, eclampsia, and stillbirth and that cure is delivery.  At this time I recommend inpatient admission with treatment of her blood pressure  with the goal of 140/90's avg 135/85 mmHg using our blood pressure control protocol.  She has received MagSo4 and first dose of steroids. She is also receiving oral therapy.   Should her blood pressure be controlled I recommend daily NST with 2x weekly AFI with growth every 3 weeks.    Delivery is recommended by 34 weeks.   We would not offer expectant management if she develops HELLP syndrome, persistent headache or epigastic pain, renal dysfunction  Cr > 1.1, Eclampsia or pulmonary edema. We would also not offer expectant management if her blood pressure cannot be controlled with oral therapy. We would deliver for the  following fetal indications of abnormal fetal testing    I discussed the plan of care with Dr. Billy Coast who is in agreement.   I spent 60 minutes with >50% in telephonic consultation, chart review and care coordination.    Novella Olive, MD

## 2022-10-01 NOTE — H&P (Signed)
Sarah Levy is a 37 y.o. female presenting for admission for BP management and possible indicated preterm birth. Pt with history of reported white coat htn now with severe range BPs and started on PO labetol 2w ago. Dose currently  tid. Was admitted 4/8 for BP control and hypokalemia with improvement and dc after IV repletion and PO meds for htn. Denies HAs , visual changes or epigastric pain. CBC and CMP nl on 4/17. Urine PC ration nl last wk but on 4/17 now inc from 0.2 to 2.1. Reports good FM and denies contractions. Sono AGA with nl afi and BPP 8/8 on 4/17. Admitted for further evaluation and possible delivery. OB History     Gravida  1   Para  0   Term  0   Preterm  0   AB  0   Living  0      SAB  0   IAB  0   Ectopic  0   Multiple  0   Live Births  0          Past Medical History:  Diagnosis Date   Acid reflux    Allergies    Asthma    Back pain    Hypothyroidism    Joint pain    SOB (shortness of breath)    Past Surgical History:  Procedure Laterality Date   WISDOM TOOTH EXTRACTION     Family History: family history includes Diabetes in her father; Heart disease in her father; High Cholesterol in her father and mother; High blood pressure in her father; Sudden death in her father. Social History:  reports that she has never smoked. She has never used smokeless tobacco. No history on file for alcohol use and drug use.     Maternal Diabetes: No- failed one hr gtt , nl 3gtt ( one abnl value) Genetic Screening: Normal Maternal Ultrasounds/Referrals: Normal Fetal Ultrasounds or other Referrals:  None Maternal Substance Abuse:  No Significant Maternal Medications:  Meds include: Protonix Syntroid Significant Maternal Lab Results:  None Number of Prenatal Visits:greater than 3 verified prenatal visits Other Comments:  None  Review of Systems  Constitutional: Negative.   Respiratory: Negative.    Cardiovascular: Negative.   Endocrine:  Negative.   Genitourinary: Negative.   All other systems reviewed and are negative.  Maternal Medical History:  Fetal activity: Perceived fetal activity is normal.   Last perceived fetal movement was within the past hour.   Prenatal complications: Pre-eclampsia.   Prenatal Complications - Diabetes: none.     There were no vitals taken for this visit. Maternal Exam:  Uterine Assessment: Contraction strength is mild.  Contraction frequency is rare.  Abdomen: Patient reports no abdominal tenderness. Fetal presentation: vertex Introitus: Normal vulva. Normal vagina.  Ferning test: not done.  Nitrazine test: not done. Amniotic fluid character: not assessed.   Physical Exam Vitals and nursing note reviewed.  Constitutional:      Appearance: Normal appearance. She is obese.  HENT:     Head: Normocephalic and atraumatic.  Cardiovascular:     Rate and Rhythm: Normal rate and regular rhythm.     Pulses: Normal pulses.     Heart sounds: Normal heart sounds.  Pulmonary:     Effort: Pulmonary effort is normal.     Breath sounds: Normal breath sounds.  Abdominal:     General: Bowel sounds are normal.     Palpations: Abdomen is soft.  Genitourinary:    General: Normal vulva.  Musculoskeletal:        General: Normal range of motion.     Cervical back: Normal range of motion and neck supple.  Skin:    General: Skin is warm.  Neurological:     General: No focal deficit present.     Mental Status: She is alert and oriented to person, place, and time.  Psychiatric:        Mood and Affect: Mood normal.        Behavior: Behavior normal.     Prenatal labs: ABO, Rh: --/--/B NEG (04/08 1830) Antibody: NEG (04/08 1830) Rubella:  imm RPR:   neg rpt today HBsAg:   neg HIV:   neg GBS:   not done  Assessment/Plan: 29w 6d IUP PEC vs chtn with superimposed PEC- BP criteria. Labs nl. AGA fetus. \Hypothyroidism on meds- tsh today Abnl 1gtt with nl 3 gtt Maternal anxiety Hypokalemia  of ? Etx. Admit Serial labs NST daily MFM consult Labetalol  q 8 BMZ Mag neuroprophylaxis.   Sarah Levy 10/01/2022, 10:54 AM

## 2022-10-02 ENCOUNTER — Inpatient Hospital Stay (HOSPITAL_COMMUNITY): Payer: BC Managed Care – PPO | Admitting: Anesthesiology

## 2022-10-02 ENCOUNTER — Encounter (HOSPITAL_COMMUNITY): Payer: Self-pay | Admitting: Obstetrics and Gynecology

## 2022-10-02 LAB — CBC
HCT: 31.4 % — ABNORMAL LOW (ref 36.0–46.0)
Hemoglobin: 10.6 g/dL — ABNORMAL LOW (ref 12.0–15.0)
MCH: 28.5 pg (ref 26.0–34.0)
MCHC: 33.8 g/dL (ref 30.0–36.0)
MCV: 84.4 fL (ref 80.0–100.0)
Platelets: 433 10*3/uL — ABNORMAL HIGH (ref 150–400)
RBC: 3.72 MIL/uL — ABNORMAL LOW (ref 3.87–5.11)
RDW: 14.4 % (ref 11.5–15.5)
WBC: 15.5 10*3/uL — ABNORMAL HIGH (ref 4.0–10.5)
nRBC: 0 % (ref 0.0–0.2)

## 2022-10-02 LAB — COMPREHENSIVE METABOLIC PANEL
ALT: 21 U/L (ref 0–44)
AST: 16 U/L (ref 15–41)
Albumin: 2.4 g/dL — ABNORMAL LOW (ref 3.5–5.0)
Alkaline Phosphatase: 86 U/L (ref 38–126)
Anion gap: 9 (ref 5–15)
BUN: <5 mg/dL — ABNORMAL LOW (ref 6–20)
CO2: 22 mmol/L (ref 22–32)
Calcium: 7 mg/dL — ABNORMAL LOW (ref 8.9–10.3)
Chloride: 101 mmol/L (ref 98–111)
Creatinine, Ser: 0.66 mg/dL (ref 0.44–1.00)
GFR, Estimated: >60 mL/min (ref >60)
Glucose, Bld: 122 mg/dL — ABNORMAL HIGH (ref 70–99)
Potassium: 3.2 mmol/L — ABNORMAL LOW (ref 3.5–5.1)
Sodium: 132 mmol/L — ABNORMAL LOW (ref 135–145)
Total Bilirubin: 0.3 mg/dL (ref 0.3–1.2)
Total Protein: 5.8 g/dL — ABNORMAL LOW (ref 6.5–8.1)

## 2022-10-02 LAB — PROTEIN, URINE, 24 HOUR
Collection Interval-UPROT: 24 hours
Protein, 24H Urine: 1 mg/d — ABNORMAL LOW (ref 50–100)
Protein, Urine: 21 mg/dL
Urine Total Volume-UPROT: 6.25 mL

## 2022-10-02 LAB — RPR: RPR Ser Ql: NONREACTIVE

## 2022-10-02 MED ORDER — MAGNESIUM OXIDE -MG SUPPLEMENT 400 (240 MG) MG PO TABS
400.0000 mg | ORAL_TABLET | Freq: Every day | ORAL | Status: DC
  Administered 2022-10-02 – 2022-10-03 (×2): 400 mg via ORAL
  Filled 2022-10-02 (×3): qty 1

## 2022-10-02 MED ORDER — MAGNESIUM 400 MG PO TABS: 400.0000 mg | ORAL_TABLET | Freq: Every day | ORAL | Status: DC

## 2022-10-02 NOTE — Progress Notes (Signed)
CSW acknowledges social work consult placed due to [employer]. CSW notified chaplain of request. Chaplain to follow up.   Signed,  Norberto Sorenson, MSW, LCSWA, LCASA 10/02/2022 9:19 AM

## 2022-10-02 NOTE — Progress Notes (Signed)
Sarah Levy 36 y.o. G1P0000 at [redacted]w[redacted]d HD#2 admitted with preeclampsia with severe features  S: Patient doing well today. Reports having some dizziness with ambulation overnight while on the Magnesium drip but doing much better now s/p Mag. She denies any HA, vision changes, CP, or SOB. Reports good FM. No CTXs, VB, or LOF.   Patient's wife [name] at bedside, supportive.  O: Vitals:   10/02/22 1150 10/02/22 1330 10/02/22 1410 10/02/22 1552  BP:   (!) 149/84   Pulse:   82 80  Resp: Temp:   97.8 F (36.6 C)   TempSrc:   Oral   SpO2:      Weight:      Height:       Exam: -General: AAO, NAD -Cardiovascular: RRR -Respiratory: normal effort -Abdomen: gravid uterus -Extremities: trace edema     Latest Ref Rng & Units 10/02/2022    7:29 AM 10/01/2022   11:54 AM 09/20/2022    4:46 PM  CBC  WBC 4.0 - 10.5 K/uL 15.5  13.3  13.5   Hemoglobin 12.0 - 15.0 g/dL 29.5  62.1  30.8   Hematocrit 36.0 - 46.0 % 31.4  34.0  30.1   Platelets 150 - 400 K/uL 433  442  437       Latest Ref Rng & Units 10/02/2022    7:29 AM 10/01/2022   11:54 AM 09/22/2022    5:22 AM  CMP  Glucose 70 - 99 mg/dL 657  97  93   BUN 6 - 20 mg/dL <5  <5  <5   Creatinine 0.44 - 1.00 mg/dL 8.46  9.62  9.52   Sodium 135 - 145 mmol/L 132  136  137   Potassium 3.5 - 5.1 mmol/L 3.2  3.0  2.8   Chloride 98 - 111 mmol/L 101  104  106   CO2 22 - 32 mmol/L Calcium 8.9 - 10.3 mg/dL 7.0  8.5  8.8   Total Protein 6.5 - 8.1 g/dL 5.8  6.2  5.5   Total Bilirubin 0.3 - 1.2 mg/dL 0.3  0.4  0.4   Alkaline Phos 38 - 126 U/L 86  97  75   AST 15 - 41 U/L ALT 0 - 44 U/L Fetal Monitoring: -Continuous EFM overnight while on Magnesium: Category I baseline 125 bpm moderate variability with accelerations present, no decelerations -Acontractile on Toco  [place] MFM 4/19 (report pending images reviewed) Cephalic, AFI 11 cm [place] for Growth in office 4/17 EFW 3#3 (44%)  A/P: Sarah Vossler  Levy 36 y.o. G1P0000 at [redacted]w[redacted]d HD#2 admitted with preeclampsia with severe features, now clinically stable  Preeclampsia with severe features -Antihypertensives titrated to PO Labetalol 600 mg q 8hr and Procardia 30XL BID with improved BP control. No IV protocol needed since yesterday afternoon -PIH labs this morning WNL -Urine P:Cr in office elevated 2.1, 24hr urine protein collected results pending -S/p MFM consultation appreciate recommendations: 2x/week AFI , serial growth q 3 weeks, maintain BP goal 130-140/85-90 with goal of 34 weeks for delivery but earlier if needed secondary to inadequate BP control or abnormal fetal testing or development of HELLP Prematurity  -S/p 24hr Mag neuroprotection stopped today ~1230 -S/p BMZ x2 4/19-4/20 -NICU consult pending Hypokalemia -Unknown etiology, K+ 3.2 this morning improved from 3.0 yesterday -Continue oral replacement and repeat labs in AM  Hypothyroidism -Continue home Levothyroxine  Antepartum -Regular diet -PNV daily -Continue inpatient care   Joycelynn Fritsche A Toy Samarin 10/02/22 4:19 PM

## 2022-10-02 NOTE — Consult Note (Signed)
Neonatology Consult Note:  At the request of the patients obstetrician Dr. Billy Coast I met with Sarah Levy (and her wife) who is a 37 y.o. G1P0000 at [redacted]w[redacted]d HD#2 admitted with preeclampsia with severe features.  Pregnancy complicated by Anxiety, AMA, Abnl 1gtt, Hypokalemia of unknown etiology.     We discussed morbidity/mortality at this gestional age, delivery room resuscitation, including intubation and surfactant in DR.  Discussed mechanical ventilation and risk for chronic lung disease, risk for IVH with potential for motor / cognitive deficits, ROP, NEC, sepsis, as well as temperature instability and feeding immaturity.  Discussed NG / OG feeds, benefits of MBM in reducing incidence of NEC.   Discussed likely length of stay.  Thank you for allowing [place] to participate in her care.  Please call with questions.  John Giovanni, DO  Neonatologist  The total length of face-to-face or floor / unit time for this encounter was 35 minutes.  Counseling and / or coordination of care was greater than fifty percent of the time.

## 2022-10-02 NOTE — Progress Notes (Signed)
Received referral from CSW, patient requesting assistance with advance directive. Visited patient  and discuss with patient and spouse. Provided advance directive education. Both patient and spouse wish to complete advance directives. However they would like to review the forms further. Patient stated she will be here for about 4 weeks so there is no rush. Advised patient and wife that when they are ready have the nurse or CSW contact chaplain's office. 2 forms were left with them.

## 2022-10-02 NOTE — Plan of Care (Signed)
  Problem: Education: Goal: Knowledge of General Education information will improve Description: Including pain rating scale, medication(s)/side effects and non-pharmacologic comfort measures Outcome: Progressing   Problem: Health Behavior/Discharge Planning: Goal: Ability to manage health-related needs will improve Outcome: Progressing   Problem: Clinical Measurements: Goal: Ability to maintain clinical measurements within normal limits will improve Outcome: Progressing Goal: Will remain free from infection Outcome: Progressing Goal: Diagnostic test results will improve Outcome: Progressing Goal: Respiratory complications will improve Outcome: Progressing Goal: Cardiovascular complication will be avoided Outcome: Progressing   Problem: Activity: Goal: Risk for activity intolerance will decrease Outcome: Progressing   Problem: Nutrition: Goal: Adequate nutrition will be maintained Outcome: Progressing   Problem: Coping: Goal: Level of anxiety will decrease Outcome: Progressing   Problem: Elimination: Goal: Will not experience complications related to bowel motility Outcome: Progressing Goal: Will not experience complications related to urinary retention Outcome: Progressing   Problem: Pain Managment: Goal: General experience of comfort will improve Outcome: Progressing   Problem: Safety: Goal: Ability to remain free from injury will improve Outcome: Progressing   Problem: Skin Integrity: Goal: Risk for impaired skin integrity will decrease Outcome: Progressing   Problem: Education: Goal: Knowledge of disease or condition will improve Outcome: Progressing Goal: Knowledge of the prescribed therapeutic regimen will improve Outcome: Progressing Goal: Individualized Educational Video(s) Outcome: Progressing   Problem: Clinical Measurements: Goal: Complications related to the disease process, condition or treatment will be avoided or minimized Outcome:  Progressing   Problem: Education: Goal: Knowledge of disease or condition will improve Outcome: Progressing Goal: Knowledge of the prescribed therapeutic regimen will improve Outcome: Progressing   Problem: Fluid Volume: Goal: Peripheral tissue perfusion will improve Outcome: Progressing   Problem: Clinical Measurements: Goal: Complications related to disease process, condition or treatment will be avoided or minimized Outcome: Progressing   

## 2022-10-02 NOTE — Anesthesia Preprocedure Evaluation (Deleted)
Anesthesia Evaluation    Airway        Dental   Pulmonary           Cardiovascular      Neuro/Psych    GI/Hepatic   Endo/Other    Renal/GU      Musculoskeletal negative musculoskeletal ROS (+)    Abdominal   Peds  Hematology   Anesthesia Other Findings   Reproductive/Obstetrics                             Anesthesia Physical Anesthesia Plan Anesthesia Quick Evaluation

## 2022-10-03 LAB — PROTEIN / CREATININE RATIO, URINE
Creatinine, Urine: 66 mg/dL
Protein Creatinine Ratio: 1.05 mg/mg{Cre} — ABNORMAL HIGH (ref 0.00–0.15)
Total Protein, Urine: 69 mg/dL

## 2022-10-03 LAB — CBC
HCT: 31.6 % — ABNORMAL LOW (ref 36.0–46.0)
Hemoglobin: 10.2 g/dL — ABNORMAL LOW (ref 12.0–15.0)
MCH: 28.4 pg (ref 26.0–34.0)
MCHC: 32.3 g/dL (ref 30.0–36.0)
MCV: 88 fL (ref 80.0–100.0)
Platelets: 456 10*3/uL — ABNORMAL HIGH (ref 150–400)
RBC: 3.59 MIL/uL — ABNORMAL LOW (ref 3.87–5.11)
RDW: 14.5 % (ref 11.5–15.5)
WBC: 17.3 10*3/uL — ABNORMAL HIGH (ref 4.0–10.5)
nRBC: 0.1 % (ref 0.0–0.2)

## 2022-10-03 LAB — COMPREHENSIVE METABOLIC PANEL
ALT: 23 U/L (ref 0–44)
AST: 21 U/L (ref 15–41)
Albumin: 2.4 g/dL — ABNORMAL LOW (ref 3.5–5.0)
Alkaline Phosphatase: 77 U/L (ref 38–126)
Anion gap: 8 (ref 5–15)
BUN: 6 mg/dL (ref 6–20)
CO2: 22 mmol/L (ref 22–32)
Calcium: 8.4 mg/dL — ABNORMAL LOW (ref 8.9–10.3)
Chloride: 106 mmol/L (ref 98–111)
Creatinine, Ser: 0.69 mg/dL (ref 0.44–1.00)
GFR, Estimated: >60 mL/min (ref >60)
Glucose, Bld: 108 mg/dL — ABNORMAL HIGH (ref 70–99)
Potassium: 3.4 mmol/L — ABNORMAL LOW (ref 3.5–5.1)
Sodium: 136 mmol/L (ref 135–145)
Total Bilirubin: 0.4 mg/dL (ref 0.3–1.2)
Total Protein: 5.9 g/dL — ABNORMAL LOW (ref 6.5–8.1)

## 2022-10-03 MED ORDER — SERTRALINE HCL 50 MG PO TABS
25.0000 mg | ORAL_TABLET | Freq: Every day | ORAL | Status: DC
  Administered 2022-10-03 – 2022-10-08 (×5): 25 mg via ORAL
  Filled 2022-10-03 (×5): qty 1

## 2022-10-03 MED ORDER — TETANUS-DIPHTH-ACELL PERTUSSIS 5-2.5-18.5 LF-MCG/0.5 IM SUSY
0.5000 mL | PREFILLED_SYRINGE | Freq: Once | INTRAMUSCULAR | Status: AC
  Administered 2022-10-03: 0.5 mL via INTRAMUSCULAR
  Filled 2022-10-03: qty 0.5

## 2022-10-03 MED ORDER — RHO D IMMUNE GLOBULIN 1500 UNIT/2ML IJ SOSY
300.0000 ug | PREFILLED_SYRINGE | Freq: Once | INTRAMUSCULAR | Status: AC
  Administered 2022-10-04: 300 ug via INTRAVENOUS
  Filled 2022-10-03: qty 2

## 2022-10-03 MED ORDER — HYDROXYZINE HCL 50 MG PO TABS
25.0000 mg | ORAL_TABLET | Freq: Three times a day (TID) | ORAL | Status: DC | PRN
  Administered 2022-10-03 – 2022-10-04 (×2): 25 mg via ORAL
  Filled 2022-10-03 (×2): qty 1

## 2022-10-03 MED ORDER — BUSPIRONE HCL 15 MG PO TABS
7.5000 mg | ORAL_TABLET | Freq: Two times a day (BID) | ORAL | Status: DC
  Administered 2022-10-03 – 2022-10-04 (×2): 7.5 mg via ORAL
  Filled 2022-10-03 (×3): qty 1

## 2022-10-03 NOTE — Plan of Care (Signed)
  Problem: Education: Goal: Knowledge of General Education information will improve Description: Including pain rating scale, medication(s)/side effects and non-pharmacologic comfort measures Outcome: Progressing   Problem: Health Behavior/Discharge Planning: Goal: Ability to manage health-related needs will improve Outcome: Progressing   Problem: Clinical Measurements: Goal: Ability to maintain clinical measurements within normal limits will improve Outcome: Progressing Goal: Will remain free from infection Outcome: Progressing Goal: Diagnostic test results will improve Outcome: Progressing Goal: Respiratory complications will improve Outcome: Progressing Goal: Cardiovascular complication will be avoided Outcome: Progressing   Problem: Activity: Goal: Risk for activity intolerance will decrease Outcome: Progressing   Problem: Nutrition: Goal: Adequate nutrition will be maintained Outcome: Progressing   Problem: Coping: Goal: Level of anxiety will decrease Outcome: Progressing   Problem: Elimination: Goal: Will not experience complications related to bowel motility Outcome: Progressing Goal: Will not experience complications related to urinary retention Outcome: Progressing   Problem: Pain Managment: Goal: General experience of comfort will improve Outcome: Progressing   Problem: Safety: Goal: Ability to remain free from injury will improve Outcome: Progressing   Problem: Skin Integrity: Goal: Risk for impaired skin integrity will decrease Outcome: Progressing   Problem: Education: Goal: Knowledge of disease or condition will improve Outcome: Progressing Goal: Knowledge of the prescribed therapeutic regimen will improve Outcome: Progressing Goal: Individualized Educational Video(s) Outcome: Progressing   Problem: Clinical Measurements: Goal: Complications related to the disease process, condition or treatment will be avoided or minimized Outcome:  Progressing   Problem: Education: Goal: Knowledge of disease or condition will improve Outcome: Progressing Goal: Knowledge of the prescribed therapeutic regimen will improve Outcome: Progressing   Problem: Fluid Volume: Goal: Peripheral tissue perfusion will improve Outcome: Progressing   Problem: Clinical Measurements: Goal: Complications related to disease process, condition or treatment will be avoided or minimized Outcome: Progressing   

## 2022-10-03 NOTE — Progress Notes (Signed)
Met pt. History reviewed.  Pt notes feeling great, a little anxious with last bp check as child sick at home, pt notes poor sleep since admission.   Pt notes no HA, no vision change, no RUQ pain  Vitals:   10/03/22 1215 10/03/22 1553 10/03/22 1555 10/03/22 2002  BP: 138/67 (!) 150/76  (!) 171/87  Pulse: 80 80  76  Resp:  18  16  Temp:  98 F (36.7 C)  98.6 F (37 C)  TempSrc:  Oral  Oral  SpO2:   99% 99%  Weight:      Height:       A/P; gest htn with severe range bps, titrating bp meds. Proteinuria, otherwise normal labs. No sx of PEC. S/p Mag sulfate. -hypokalemia, on oral K/ Mag -htn, likley impacted by anxiety. Will titrate bp meds -anxiety, pt admits to anxiety with vital check. Will start Buspar and Zoloft. IV push meds prn.  -Rh neg, will check donor info, if also neg will not need Rhogam. -prematurity, s/p BMZ x 2.   - current htn- will give buspar then recheck bp in 1 hr. Given pts clinical stability to my exam I asked her nurse and NT to defer bp check for 1 hr after Buspar.  Lendon Colonel 10/03/2022 8:33 PM

## 2022-10-03 NOTE — Progress Notes (Signed)
Sarah Levy 37 y.o. G1P0000 at [redacted]w[redacted]d HD#3 admitted with preeclampsia with severe features  S: Patient doing well today. She denies any HA, vision changes, CP, or SOB. Reports good FM. No CTXs, VB, or LOF.   Patient's wife Sarah Levy on speaker phone  O: Vitals:   10/02/22 2037 10/02/22 2319 10/03/22 0417 10/03/22 0817  BP: (!) 159/80 (!) 142/80 (!) 145/78 (!) 153/79  Pulse: 80 89 88 79  Resp: Temp: 97.8 F (36.6 C) 98.2 F (36.8 C) 98.3 F (36.8 C) 98.5 F (36.9 C)  TempSrc: Oral Oral Oral Oral  SpO2: 98% 99% 100%   Weight:      Height:       Exam: -General: AAO, NAD -Cardiovascular: RRR -Respiratory: normal effort -Abdomen: gravid uterus -Extremities: trace edema     Latest Ref Rng & Units 10/03/2022   12:37 PM 10/02/2022    7:29 AM 10/01/2022   11:54 AM  CBC  WBC 4.0 - 10.5 K/uL 17.3  15.5  13.3   Hemoglobin 12.0 - 15.0 g/dL 16.1  09.6  04.5   Hematocrit 36.0 - 46.0 % 31.6  31.4  34.0   Platelets 150 - 400 K/uL 456  433  442       Latest Ref Rng & Units 10/03/2022   12:37 PM 10/02/2022    7:29 AM 10/01/2022   11:54 AM  CMP  Glucose 70 - 99 mg/dL 409  811  97   BUN 6 - 20 mg/dL 6  <5  <5   Creatinine 0.44 - 1.00 mg/dL 9.14  7.82  9.56   Sodium 135 - 145 mmol/L 136  132  136   Potassium 3.5 - 5.1 mmol/L 3.4  3.2  3.0   Chloride 98 - 111 mmol/L 106  101  104   CO2 22 - 32 mmol/L Calcium 8.9 - 10.3 mg/dL 8.4  7.0  8.5   Total Protein 6.5 - 8.1 g/dL 5.9  5.8  6.2   Total Bilirubin 0.3 - 1.2 mg/dL 0.4  0.3  0.4   Alkaline Phos 38 - 126 U/L 77  86  97   AST 15 - 41 U/L ALT 0 - 44 U/L Fetal Monitoring: -NST: reactive baseline 135 bpm moderate variability with accelerations present, no decelerations -Acontractile on Toco  [place] MFM 4/19 Cephalic, AFI 11 cm [place] for Growth in office 4/17 EFW 3#3 (44%)  Sarah/P: Sarah Levy 37 y.o. G1P0000 at [redacted]w[redacted]d HD#3 admitted with preeclampsia with severe features, now  clinically stable  Preeclampsia with severe features -BP's in mild elevation range on PO Labetalol 600 mg q 8hr and Procardia 30XL BID. No IV protocol needed since yesterday afternoon x1 dose. Patient attributes this to anxiety/stress- had been talking to Neonatologist and son sick. We discussed impact on stress on BP readings vs worsening PEC. We discussed plan to delay BP by 15-20 min if patient feeling anxious and continue to monitor  -PIH labs this morning WNL -Urine P:Cr in office elevated 2.1, 24hr urine protein resulted as 1mg /day-- suspect due to dilute sample. Since this is not consistent w/ office sample, will repeat Urine P:Cr here and compare  -S/p MFM consultation appreciate recommendations: 2x/week AFI , serial growth q 3 weeks, maintain BP goal 130-140/85-90 with goal of 34 weeks for delivery but earlier if needed secondary to inadequate  BP control or abnormal fetal testing or development of HELLP Prematurity  -S/p 24hr Mag neuroprotection -S/p BMZ x2 4/19-4/20 -S/p NICU consult Hypokalemia -Unknown etiology, K+ improved to 3.4 today -Continue oral replacement and repeat labs in AM Hypothyroidism -Continue home Levothyroxine  RH NEG -Rhogam protocol ordered for tomorrow when due for updated inpatient TS Antepartum -Regular diet -PNV daily -Tdap ordered -Hydroxyzine ordered PRN anxiety/sleep -Continue inpatient care   Sarah Levy Sarah Levy 10/03/22 1:54 PM

## 2022-10-03 NOTE — Progress Notes (Signed)
This nurse verbally told by Raye Sorrow, MD not to take patients blood pressure at this time, to recheck blood pressure 1 hour after buspar administration. This nurse has sent message to pharmacy asking them to send Buspar as soon as possible. Awaiting medication arrival will administer medication as soon as this nurse receives it.

## 2022-10-04 ENCOUNTER — Inpatient Hospital Stay (HOSPITAL_COMMUNITY): Payer: BC Managed Care – PPO | Admitting: Anesthesiology

## 2022-10-04 ENCOUNTER — Encounter (HOSPITAL_COMMUNITY): Payer: Self-pay | Admitting: Obstetrics and Gynecology

## 2022-10-04 ENCOUNTER — Encounter (HOSPITAL_COMMUNITY)
Admission: AD | Disposition: A | Discharge status: Home / Self Care | Payer: Self-pay | Source: Home / Self Care | Attending: Obstetrics and Gynecology

## 2022-10-04 DIAGNOSIS — Z98891 History of uterine scar from previous surgery: Secondary | ICD-10-CM

## 2022-10-04 LAB — CBC
HCT: 33.2 % — ABNORMAL LOW (ref 36.0–46.0)
Hemoglobin: 10.7 g/dL — ABNORMAL LOW (ref 12.0–15.0)
MCH: 28 pg (ref 26.0–34.0)
MCHC: 32.2 g/dL (ref 30.0–36.0)
MCV: 86.9 fL (ref 80.0–100.0)
Platelets: 458 10*3/uL — ABNORMAL HIGH (ref 150–400)
RBC: 3.82 MIL/uL — ABNORMAL LOW (ref 3.87–5.11)
RDW: 14.3 % (ref 11.5–15.5)
WBC: 15.3 10*3/uL — ABNORMAL HIGH (ref 4.0–10.5)
nRBC: 0.1 % (ref 0.0–0.2)

## 2022-10-04 LAB — TYPE AND SCREEN
ABO/RH(D): B NEG
Antibody Screen: POSITIVE

## 2022-10-04 LAB — COMPREHENSIVE METABOLIC PANEL
ALT: 29 U/L (ref 0–44)
AST: 26 U/L (ref 15–41)
Albumin: 2.9 g/dL — ABNORMAL LOW (ref 3.5–5.0)
Alkaline Phosphatase: 45 U/L (ref 38–126)
Anion gap: 9 (ref 5–15)
BUN: 8 mg/dL (ref 6–20)
CO2: 20 mmol/L — ABNORMAL LOW (ref 22–32)
Calcium: 8.7 mg/dL — ABNORMAL LOW (ref 8.9–10.3)
Chloride: 109 mmol/L (ref 98–111)
Creatinine, Ser: 0.75 mg/dL (ref 0.44–1.00)
GFR, Estimated: >60 mL/min (ref >60)
Glucose, Bld: 102 mg/dL — ABNORMAL HIGH (ref 70–99)
Potassium: 3.7 mmol/L (ref 3.5–5.1)
Sodium: 138 mmol/L (ref 135–145)
Total Bilirubin: 0.6 mg/dL (ref 0.3–1.2)
Total Protein: 6.7 g/dL (ref 6.5–8.1)

## 2022-10-04 LAB — RH IG WORKUP (INCLUDES ABO/RH)
Gestational Age(Wks): 30
Unit division: 0

## 2022-10-04 LAB — URIC ACID: Uric Acid, Serum: 4.1 mg/dL (ref 2.5–7.1)

## 2022-10-04 LAB — MAGNESIUM: Magnesium: 1.5 mg/dL — ABNORMAL LOW (ref 1.7–2.4)

## 2022-10-04 SURGERY — Surgical Case
Anesthesia: Spinal

## 2022-10-04 MED ORDER — MAGNESIUM SULFATE BOLUS VIA INFUSION
4.0000 g | Freq: Once | INTRAVENOUS | Status: AC
  Administered 2022-10-04: 4 g via INTRAVENOUS
  Filled 2022-10-04: qty 1000

## 2022-10-04 MED ORDER — STERILE WATER FOR IRRIGATION IR SOLN
Status: DC | PRN
  Administered 2022-10-04: 1

## 2022-10-04 MED ORDER — LABETALOL HCL 5 MG/ML IV SOLN
80.0000 mg | INTRAVENOUS | Status: DC | PRN
  Administered 2022-10-06 – 2022-10-07 (×2): 80 mg via INTRAVENOUS
  Filled 2022-10-04 (×2): qty 16

## 2022-10-04 MED ORDER — OXYTOCIN-SODIUM CHLORIDE 30-0.9 UT/500ML-% IV SOLN
2.5000 [IU]/h | INTRAVENOUS | Status: AC
  Administered 2022-10-04: 2.5 [IU]/h via INTRAVENOUS

## 2022-10-04 MED ORDER — LACTATED RINGERS IV SOLN: INTRAVENOUS | Status: DC

## 2022-10-04 MED ORDER — PHENYLEPHRINE 80 MCG/ML (10ML) SYRINGE FOR IV PUSH (FOR BLOOD PRESSURE SUPPORT)
PREFILLED_SYRINGE | INTRAVENOUS | Status: AC
  Filled 2022-10-04: qty 10

## 2022-10-04 MED ORDER — MORPHINE SULFATE (PF) 0.5 MG/ML IJ SOLN
INTRAMUSCULAR | Status: AC
  Filled 2022-10-04: qty 10

## 2022-10-04 MED ORDER — SIMETHICONE 80 MG PO CHEW
80.0000 mg | CHEWABLE_TABLET | Freq: Three times a day (TID) | ORAL | Status: DC
  Administered 2022-10-05 – 2022-10-08 (×11): 80 mg via ORAL
  Filled 2022-10-04 (×11): qty 1

## 2022-10-04 MED ORDER — KETOROLAC TROMETHAMINE 30 MG/ML IJ SOLN
30.0000 mg | Freq: Four times a day (QID) | INTRAMUSCULAR | Status: AC
  Administered 2022-10-04 – 2022-10-05 (×4): 30 mg via INTRAVENOUS
  Filled 2022-10-04 (×4): qty 1

## 2022-10-04 MED ORDER — MAGNESIUM SULFATE 40 GM/1000ML IV SOLN
INTRAVENOUS | Status: AC
  Filled 2022-10-04: qty 1000

## 2022-10-04 MED ORDER — LABETALOL HCL 5 MG/ML IV SOLN
20.0000 mg | INTRAVENOUS | Status: DC | PRN
  Administered 2022-10-04 – 2022-10-07 (×3): 20 mg via INTRAVENOUS
  Filled 2022-10-04 (×2): qty 4

## 2022-10-04 MED ORDER — DIBUCAINE (PERIANAL) 1 % EX OINT: 1.0000 | TOPICAL_OINTMENT | CUTANEOUS | Status: DC | PRN

## 2022-10-04 MED ORDER — ZOLPIDEM TARTRATE 5 MG PO TABS: 5.0000 mg | ORAL_TABLET | Freq: Every evening | ORAL | Status: DC | PRN

## 2022-10-04 MED ORDER — OXYCODONE HCL 5 MG PO TABS
5.0000 mg | ORAL_TABLET | ORAL | Status: DC | PRN
  Administered 2022-10-05: 5 mg via ORAL
  Administered 2022-10-06 (×5): 10 mg via ORAL
  Administered 2022-10-07 (×2): 5 mg via ORAL
  Administered 2022-10-07: 10 mg via ORAL
  Administered 2022-10-07 (×2): 5 mg via ORAL
  Administered 2022-10-07 – 2022-10-08 (×2): 10 mg via ORAL
  Administered 2022-10-08: 5 mg via ORAL
  Filled 2022-10-04: qty 1
  Filled 2022-10-04: qty 2
  Filled 2022-10-04 (×3): qty 1
  Filled 2022-10-04 (×3): qty 2
  Filled 2022-10-04: qty 1
  Filled 2022-10-04: qty 2
  Filled 2022-10-04: qty 1
  Filled 2022-10-04: qty 2
  Filled 2022-10-04: qty 1
  Filled 2022-10-04 (×2): qty 2

## 2022-10-04 MED ORDER — DIPHENHYDRAMINE HCL 50 MG/ML IJ SOLN: 12.5000 mg | INTRAMUSCULAR | Status: DC | PRN

## 2022-10-04 MED ORDER — METOCLOPRAMIDE HCL 5 MG/ML IJ SOLN
INTRAMUSCULAR | Status: AC
  Filled 2022-10-04: qty 2

## 2022-10-04 MED ORDER — BUPIVACAINE LIPOSOME 1.3 % IJ SUSP
INTRAMUSCULAR | Status: DC | PRN
  Administered 2022-10-04: 20 mL

## 2022-10-04 MED ORDER — BUPIVACAINE HCL (PF) 0.25 % IJ SOLN
INTRAMUSCULAR | Status: AC
  Filled 2022-10-04: qty 10

## 2022-10-04 MED ORDER — ONDANSETRON HCL 4 MG/2ML IJ SOLN
INTRAMUSCULAR | Status: AC
  Filled 2022-10-04: qty 2

## 2022-10-04 MED ORDER — TRANEXAMIC ACID-NACL 1000-0.7 MG/100ML-% IV SOLN
INTRAVENOUS | Status: AC
  Filled 2022-10-04: qty 100

## 2022-10-04 MED ORDER — LABETALOL HCL 5 MG/ML IV SOLN
40.0000 mg | INTRAVENOUS | Status: DC | PRN
  Administered 2022-10-06 – 2022-10-07 (×2): 40 mg via INTRAVENOUS
  Filled 2022-10-04 (×2): qty 8

## 2022-10-04 MED ORDER — FENTANYL CITRATE (PF) 100 MCG/2ML IJ SOLN
INTRAMUSCULAR | Status: AC
  Filled 2022-10-04: qty 2

## 2022-10-04 MED ORDER — HYDRALAZINE HCL 50 MG PO TABS
25.0000 mg | ORAL_TABLET | Freq: Four times a day (QID) | ORAL | Status: DC
  Administered 2022-10-04 (×2): 25 mg via ORAL
  Filled 2022-10-04 (×2): qty 1

## 2022-10-04 MED ORDER — BUPIVACAINE HCL (PF) 0.25 % IJ SOLN
INTRAMUSCULAR | Status: DC | PRN
  Administered 2022-10-04: 10 mL

## 2022-10-04 MED ORDER — COCONUT OIL OIL: 1.0000 | TOPICAL_OIL | Status: DC | PRN

## 2022-10-04 MED ORDER — OXYTOCIN-SODIUM CHLORIDE 30-0.9 UT/500ML-% IV SOLN
INTRAVENOUS | Status: DC | PRN
  Administered 2022-10-04: 400 mL via INTRAVENOUS

## 2022-10-04 MED ORDER — PHENYLEPHRINE HCL-NACL 20-0.9 MG/250ML-% IV SOLN
INTRAVENOUS | Status: AC
  Filled 2022-10-04: qty 250

## 2022-10-04 MED ORDER — IBUPROFEN 600 MG PO TABS
600.0000 mg | ORAL_TABLET | Freq: Four times a day (QID) | ORAL | Status: DC
  Administered 2022-10-06 – 2022-10-08 (×10): 600 mg via ORAL
  Filled 2022-10-04 (×10): qty 1

## 2022-10-04 MED ORDER — CEFAZOLIN SODIUM-DEXTROSE 2-3 GM-%(50ML) IV SOLR
INTRAVENOUS | Status: DC | PRN
  Administered 2022-10-04: 2 g via INTRAVENOUS

## 2022-10-04 MED ORDER — LABETALOL HCL 200 MG PO TABS
800.0000 mg | ORAL_TABLET | Freq: Three times a day (TID) | ORAL | Status: DC
  Administered 2022-10-04 – 2022-10-08 (×14): 800 mg via ORAL
  Filled 2022-10-04 (×14): qty 4

## 2022-10-04 MED ORDER — HYDRALAZINE HCL 20 MG/ML IJ SOLN: 10.0000 mg | INTRAMUSCULAR | Status: DC | PRN

## 2022-10-04 MED ORDER — BUPIVACAINE LIPOSOME 1.3 % IJ SUSP
INTRAMUSCULAR | Status: AC
  Filled 2022-10-04: qty 20

## 2022-10-04 MED ORDER — MENTHOL 3 MG MT LOZG: 1.0000 | LOZENGE | OROMUCOSAL | Status: DC | PRN

## 2022-10-04 MED ORDER — OXYTOCIN-SODIUM CHLORIDE 30-0.9 UT/500ML-% IV SOLN
INTRAVENOUS | Status: AC
  Filled 2022-10-04: qty 500

## 2022-10-04 MED ORDER — KETOROLAC TROMETHAMINE 30 MG/ML IJ SOLN: 30.0000 mg | Freq: Once | INTRAMUSCULAR | Status: DC | PRN

## 2022-10-04 MED ORDER — DIPHENHYDRAMINE HCL 25 MG PO CAPS: 25.0000 mg | ORAL_CAPSULE | ORAL | Status: DC | PRN

## 2022-10-04 MED ORDER — PHENYLEPHRINE HCL-NACL 20-0.9 MG/250ML-% IV SOLN
INTRAVENOUS | Status: DC | PRN
  Administered 2022-10-04: 30 ug/min via INTRAVENOUS

## 2022-10-04 MED ORDER — ONDANSETRON HCL 4 MG/2ML IJ SOLN: 4.0000 mg | Freq: Three times a day (TID) | INTRAMUSCULAR | Status: DC | PRN

## 2022-10-04 MED ORDER — LACTATED RINGERS IV SOLN: INTRAVENOUS | Status: DC | PRN

## 2022-10-04 MED ORDER — SODIUM CHLORIDE 0.9% FLUSH: 3.0000 mL | INTRAVENOUS | Status: DC | PRN

## 2022-10-04 MED ORDER — TRANEXAMIC ACID-NACL 1000-0.7 MG/100ML-% IV SOLN
INTRAVENOUS | Status: DC | PRN
  Administered 2022-10-04: 1000 mg via INTRAVENOUS

## 2022-10-04 MED ORDER — PRENATAL MULTIVITAMIN CH
1.0000 | ORAL_TABLET | Freq: Every day | ORAL | Status: DC
  Administered 2022-10-05 – 2022-10-08 (×4): 1 via ORAL
  Filled 2022-10-04 (×4): qty 1

## 2022-10-04 MED ORDER — HYDRALAZINE HCL 20 MG/ML IJ SOLN
10.0000 mg | INTRAMUSCULAR | Status: AC
  Administered 2022-10-04: 10 mg via INTRAVENOUS

## 2022-10-04 MED ORDER — ALPRAZOLAM 0.5 MG PO TABS
0.2500 mg | ORAL_TABLET | Freq: Once | ORAL | Status: AC
  Administered 2022-10-04: 0.25 mg via ORAL
  Filled 2022-10-04 (×2): qty 1

## 2022-10-04 MED ORDER — LABETALOL HCL 5 MG/ML IV SOLN
INTRAVENOUS | Status: AC
  Filled 2022-10-04: qty 4

## 2022-10-04 MED ORDER — NALOXONE HCL 4 MG/10ML IJ SOLN: 1.0000 ug/kg/h | INTRAVENOUS | Status: DC | PRN

## 2022-10-04 MED ORDER — MORPHINE SULFATE (PF) 0.5 MG/ML IJ SOLN
INTRAMUSCULAR | Status: DC | PRN
  Administered 2022-10-04: 150 ug via INTRATHECAL

## 2022-10-04 MED ORDER — SCOPOLAMINE 1 MG/3DAYS TD PT72
MEDICATED_PATCH | TRANSDERMAL | Status: AC
  Filled 2022-10-04: qty 1

## 2022-10-04 MED ORDER — DEXMEDETOMIDINE HCL IN NACL 80 MCG/20ML IV SOLN
INTRAVENOUS | Status: DC | PRN
  Administered 2022-10-04: 8 ug via INTRAVENOUS

## 2022-10-04 MED ORDER — SIMETHICONE 80 MG PO CHEW
80.0000 mg | CHEWABLE_TABLET | ORAL | Status: DC | PRN
  Administered 2022-10-06: 80 mg via ORAL
  Filled 2022-10-04: qty 1

## 2022-10-04 MED ORDER — MAGNESIUM SULFATE 40 GM/1000ML IV SOLN
2.0000 g/h | INTRAVENOUS | Status: AC
  Administered 2022-10-05: 2 g/h via INTRAVENOUS
  Filled 2022-10-04: qty 1000

## 2022-10-04 MED ORDER — BUPIVACAINE IN DEXTROSE 0.75-8.25 % IT SOLN
INTRATHECAL | Status: DC | PRN
  Administered 2022-10-04: 1.6 mL via INTRATHECAL

## 2022-10-04 MED ORDER — FENTANYL CITRATE (PF) 100 MCG/2ML IJ SOLN
INTRAMUSCULAR | Status: DC | PRN
  Administered 2022-10-04: 15 ug via INTRATHECAL

## 2022-10-04 MED ORDER — SOD CITRATE-CITRIC ACID 500-334 MG/5ML PO SOLN
30.0000 mL | ORAL | Status: AC
  Administered 2022-10-04: 30 mL via ORAL
  Filled 2022-10-04: qty 30

## 2022-10-04 MED ORDER — DIPHENHYDRAMINE HCL 25 MG PO CAPS
25.0000 mg | ORAL_CAPSULE | Freq: Four times a day (QID) | ORAL | Status: DC | PRN
  Administered 2022-10-05: 25 mg via ORAL
  Filled 2022-10-04: qty 1

## 2022-10-04 MED ORDER — DEXAMETHASONE SODIUM PHOSPHATE 4 MG/ML IJ SOLN
INTRAMUSCULAR | Status: AC
  Filled 2022-10-04: qty 2

## 2022-10-04 MED ORDER — ONDANSETRON HCL 4 MG/2ML IJ SOLN
INTRAMUSCULAR | Status: DC | PRN
  Administered 2022-10-04: 4 mg via INTRAVENOUS

## 2022-10-04 MED ORDER — SCOPOLAMINE 1 MG/3DAYS TD PT72
1.0000 | MEDICATED_PATCH | Freq: Once | TRANSDERMAL | Status: AC
  Administered 2022-10-04: 1.5 mg via TRANSDERMAL

## 2022-10-04 MED ORDER — NALOXONE HCL 0.4 MG/ML IJ SOLN: 0.4000 mg | INTRAMUSCULAR | Status: DC | PRN

## 2022-10-04 MED ORDER — MEPERIDINE HCL 25 MG/ML IJ SOLN: 6.2500 mg | INTRAMUSCULAR | Status: DC | PRN

## 2022-10-04 MED ORDER — HYDRALAZINE HCL 50 MG PO TABS: 25.0000 mg | ORAL_TABLET | Freq: Four times a day (QID) | ORAL | Status: DC

## 2022-10-04 MED ORDER — WITCH HAZEL-GLYCERIN EX PADS: 1.0000 | MEDICATED_PAD | CUTANEOUS | Status: DC | PRN

## 2022-10-04 MED ORDER — DEXAMETHASONE SODIUM PHOSPHATE 10 MG/ML IJ SOLN
INTRAMUSCULAR | Status: DC | PRN
  Administered 2022-10-04: 8 mg via INTRAVENOUS

## 2022-10-04 MED ORDER — SENNOSIDES-DOCUSATE SODIUM 8.6-50 MG PO TABS
2.0000 | ORAL_TABLET | Freq: Every day | ORAL | Status: DC
  Administered 2022-10-05 – 2022-10-08 (×4): 2 via ORAL
  Filled 2022-10-04 (×4): qty 2

## 2022-10-04 SURGICAL SUPPLY — 38 items
APL PRP STRL LF DISP 70% ISPRP (MISCELLANEOUS) ×2
APL SKNCLS STERI-STRIP NONHPOA (GAUZE/BANDAGES/DRESSINGS) ×1
BENZOIN TINCTURE PRP APPL 2/3 (GAUZE/BANDAGES/DRESSINGS) IMPLANT
CHLORAPREP W/TINT 26 (MISCELLANEOUS) ×2 IMPLANT
CLAMP UMBILICAL CORD (MISCELLANEOUS) ×1 IMPLANT
CLOTH BEACON ORANGE TIMEOUT ST (SAFETY) ×1 IMPLANT
DRSG OPSITE POSTOP 4X10 (GAUZE/BANDAGES/DRESSINGS) ×1 IMPLANT
ELECT REM PT RETURN 9FT ADLT (ELECTROSURGICAL) ×1
ELECTRODE REM PT RTRN 9FT ADLT (ELECTROSURGICAL) ×1 IMPLANT
EXTRACTOR VACUUM KIWI (MISCELLANEOUS) IMPLANT
GAUZE PAD ABD 7.5X8 STRL (GAUZE/BANDAGES/DRESSINGS) IMPLANT
GAUZE SPONGE 4X4 12PLY STRL LF (GAUZE/BANDAGES/DRESSINGS) IMPLANT
GLOVE BIOGEL PI IND STRL 7.0 (GLOVE) ×1 IMPLANT
GLOVE SURG LTX SZ8 (GLOVE) ×1 IMPLANT
GOWN STRL REUS W/TWL LRG LVL3 (GOWN DISPOSABLE) ×2 IMPLANT
KIT ABG SYR 3ML LUER SLIP (SYRINGE) IMPLANT
MAT PREVALON FULL STRYKER (MISCELLANEOUS) IMPLANT
NDL HYPO 25X5/8 SAFETYGLIDE (NEEDLE) IMPLANT
NDL SPNL 20GX3.5 QUINCKE YW (NEEDLE) IMPLANT
NEEDLE HYPO 22GX1.5 SAFETY (NEEDLE) ×1 IMPLANT
NEEDLE HYPO 25X5/8 SAFETYGLIDE (NEEDLE) IMPLANT
NEEDLE SPNL 20GX3.5 QUINCKE YW (NEEDLE) IMPLANT
NS IRRIG 1000ML POUR BTL (IV SOLUTION) ×1 IMPLANT
PACK C SECTION WH (CUSTOM PROCEDURE TRAY) ×1 IMPLANT
STRIP CLOSURE SKIN 1/2X4 (GAUZE/BANDAGES/DRESSINGS) IMPLANT
SUT MNCRL 0 VIOLET CTX 36 (SUTURE) ×2 IMPLANT
SUT MNCRL AB 3-0 PS2 27 (SUTURE) IMPLANT
SUT MON AB 2-0 CT1 27 (SUTURE) ×1 IMPLANT
SUT MON AB-0 CT1 36 (SUTURE) ×2 IMPLANT
SUT MONOCRYL 0 CTX 36 (SUTURE) ×2
SUT PLAIN 0 NONE (SUTURE) IMPLANT
SUT PLAIN 2 0 (SUTURE)
SUT PLAIN 2 0 XLH (SUTURE) IMPLANT
SUT PLAIN ABS 2-0 CT1 27XMFL (SUTURE) IMPLANT
SYR CONTROL 10ML LL (SYRINGE) ×1 IMPLANT
TOWEL OR 17X24 6PK STRL BLUE (TOWEL DISPOSABLE) ×1 IMPLANT
TRAY FOLEY W/BAG SLVR 14FR LF (SET/KITS/TRAYS/PACK) ×1 IMPLANT
WATER STERILE IRR 1000ML POUR (IV SOLUTION) ×1 IMPLANT

## 2022-10-04 NOTE — Progress Notes (Addendum)
Pt was seen over the weekend by on-call chaplain Ashley Royalty for initial education on Advance Directives. At that time, pt stated that she would be here several weeks. Chaplain followed up to introduce spiritual care and offered support for length of stay. Sarah Levy was accompanied by her partner.Chaplain provided brief introduction and acknowledged NPO sign on door. Sarah Levy shared that her blood pressure has gone up more and she is now scheduled for a c-section later today.  Chaplain asked open ended questions to facilitate story telling and emotional expression. The couple laughingly shared that their children already appear to be very different and that their personalities seem to be mirroring their own. Sarah Levy identifies as the less anxious and more independent of the couple. She shared that she has no particular expectations and is generally non-anxious about the change in plans beyond being hungry and cotton mouthed. Chaplain normalized varied responses to stress and encouraged pt's partner to utilize spiritual care if it would be helpful to have space to decompress and process feelings around this change in plans. She has experienced a c-section as the carrier of their 38 year old son. Chaplain named the stress of worrying about the ones we love and acknowledged the tenderness of feeling worry for both spouse and child.    At the present moment, they have not yet reviewed ACP documents. Chaplain acknowledged that upcoming surgery does at times make folks eager to complete such documents in a timely fashion and encouraged them to notify the RN if they would like to get them completed prior to surgery.  Please page as further needs arise.  Sarah Levy. Sarah Levy, M.Div. [facility] Chaplain Pager [phone] Office [phone]

## 2022-10-04 NOTE — Anesthesia Postprocedure Evaluation (Signed)
Anesthesia Post Note  Patient: Sarah Levy  Procedure(s) Performed: CESAREAN SECTION     Patient location during evaluation: PACU Anesthesia Type: Spinal Level of consciousness: awake and alert Pain management: pain level controlled Vital Signs Assessment: post-procedure vital signs reviewed and stable Respiratory status: spontaneous breathing, nonlabored ventilation and respiratory function stable Cardiovascular status: blood pressure returned to baseline and stable Postop Assessment: no apparent nausea or vomiting Anesthetic complications: no   No notable events documented.  Last Vitals:  Vitals:   10/04/22 2101 10/04/22 2216  BP:  (!) 164/86  Pulse:  72  Resp:  16  Temp:  36.7 C  SpO2: 98% 96%    Last Pain:  Vitals:   10/04/22 2216  TempSrc: Oral  PainSc: 0-No pain   Pain Goal:    LLE Motor Response: Purposeful movement (10/04/22 2130) LLE Sensation: Tingling (10/04/22 2130) RLE Motor Response: Purposeful movement (10/04/22 2130) RLE Sensation: Tingling (10/04/22 2130)     Epidural/Spinal Function Cutaneous sensation: Normal sensation (10/04/22 2216), Patient able to flex knees: Yes (10/04/22 2216), Patient able to lift hips off bed: Yes (10/04/22 2216), Back pain beyond tenderness at insertion site: No (10/04/22 2216), Progressively worsening motor and/or sensory loss: No (10/04/22 2216), Bowel and/or bladder incontinence post epidural: No (10/04/22 2216)  Lowella Curb

## 2022-10-04 NOTE — Progress Notes (Signed)
Slight delay in administering 40 mg IV Labetalol due to patient needing to have a bowel movement, will administer medication as soon as patient is done.

## 2022-10-04 NOTE — Anesthesia Procedure Notes (Signed)
Spinal  Patient location during procedure: OB Start time: 10/04/2022 6:53 PM End time: 10/04/2022 6:58 PM Reason for block: surgical anesthesia Staffing Performed: anesthesiologist  Anesthesiologist: Lowella Curb, MD Performed by: Lowella Curb, MD Authorized by: Lowella Curb, MD   Preanesthetic Checklist Completed: patient identified, IV checked, risks and benefits discussed, surgical consent, monitors and equipment checked, pre-op evaluation and timeout performed Spinal Block Patient position: sitting Prep: DuraPrep and site prepped and draped Patient monitoring: heart rate, cardiac monitor, continuous pulse ox and blood pressure Approach: midline Location: L3-4 Injection technique: single-shot Needle Needle type: Pencan  Needle gauge: 24 G Needle length: 10 cm Assessment Sensory level: T4 Events: CSF return

## 2022-10-04 NOTE — Anesthesia Preprocedure Evaluation (Addendum)
Anesthesia Evaluation  Patient identified by MRN, date of birth, ID band Patient awake    Reviewed: Allergy & Precautions, NPO status , Patient's Chart, lab work & pertinent test results  Airway Mallampati: II  TM Distance: >3 FB Neck ROM: Full    Dental no notable dental hx.    Pulmonary asthma , former smoker   Pulmonary exam normal        Cardiovascular hypertension, Pt. on medications and Pt. on home beta blockers  Rhythm:Regular Rate:Normal  Pre-E with severe features    Neuro/Psych negative neurological ROS  negative psych ROS   GI/Hepatic Neg liver ROS,GERD  Medicated,,  Endo/Other  Hypothyroidism    Renal/GU negative Renal ROS  negative genitourinary   Musculoskeletal negative musculoskeletal ROS (+)    Abdominal Normal abdominal exam  (+)   Peds  Hematology Lab Results      Component                Value               Date                      WBC                      15.3 (H)            10/04/2022                HGB                      10.7 (L)            10/04/2022                HCT                      33.2 (L)            10/04/2022                MCV                      86.9                10/04/2022                PLT                      458 (H)             10/04/2022             Lab Results      Component                Value               Date                      NA                       138                 10/04/2022                K  3.7                 10/04/2022                CO2                      20 (L)              10/04/2022                GLUCOSE                  102 (H)             10/04/2022                BUN                      8                   10/04/2022                CREATININE               0.75                10/04/2022                CALCIUM                  8.7 (L)             10/04/2022                GFRNONAA                 >60                  10/04/2022               Anesthesia Other Findings   Reproductive/Obstetrics (+) Pregnancy                             Anesthesia Physical Anesthesia Plan  ASA: 3  Anesthesia Plan: Spinal   Post-op Pain Management:    Induction:   PONV Risk Score and Plan: 2 and Ondansetron and Treatment may vary due to age or medical condition  Airway Management Planned: Natural Airway and Nasal Cannula  Additional Equipment: None  Intra-op Plan:   Post-operative Plan:   Informed Consent: I have reviewed the patients History and Physical, chart, labs and discussed the procedure including the risks, benefits and alternatives for the proposed anesthesia with the patient or authorized representative who has indicated his/her understanding and acceptance.     Dental advisory given  Plan Discussed with: CRNA  Anesthesia Plan Comments:        Anesthesia Quick Evaluation

## 2022-10-04 NOTE — Progress Notes (Signed)
This nurse notified Raye Sorrow, MD that patient had two severe range blood pressures, provider verbally ordered an increase of Labetalol to  Q8hrs and gave this nurse the ok to go ahead and administer the Labetalol 800 mg and to initiate the hypertensive protocol starting with 20 mg of IV Labetalol.

## 2022-10-04 NOTE — Progress Notes (Signed)
This nurse called [facility] to ask that patient's Buspar be sent to the unit as soon as possible.

## 2022-10-04 NOTE — Transfer of Care (Signed)
Immediate Anesthesia Transfer of Care Note  Patient: Sarah Levy  Procedure(s) Performed: CESAREAN SECTION  Patient Location: PACU  Anesthesia Type:Spinal  Level of Consciousness: awake, alert , and patient cooperative  Airway & Oxygen Therapy: Patient Spontanous Breathing  Post-op Assessment: Report given to RN and Post -op Vital signs reviewed and stable  Post vital signs: Reviewed and stable  Last Vitals:  Vitals Value Taken Time  BP 132/80 10/04/22 2000  Temp    Pulse 74 10/04/22 2002  Resp 18 10/04/22 2002  SpO2 96 % 10/04/22 2002  Vitals shown include unvalidated device data.  Last Pain:  Vitals:   10/04/22 1210  TempSrc: Oral  PainSc:          Complications: No notable events documented.

## 2022-10-04 NOTE — Op Note (Signed)
Cesarean Section Procedure Note  Indications: severe preeclampsia  Pre-operative Diagnosis: 30 week 3 day pregnancy.  Post-operative Diagnosis: same  Surgeon: Lenoard Aden   Assistants: Yetta Barre, CNM  Anesthesia: Local anesthesia 0.25.% bupivacaine and Spinal anesthesia  ASA Class: 2  Procedure Details  The patient was seen in the Holding Room. The risks, benefits, complications, treatment options, and expected outcomes were discussed with the patient.  The patient concurred with the proposed plan, giving informed consent. The risks of anesthesia, infection, bleeding and possible injury to other organs discussed. Injury to bowel, bladder, or ureter with possible need for repair discussed. Possible need for transfusion with secondary risks of hepatitis or HIV acquisition discussed. Post operative complications to include but not limited to DVT, PE and Pneumonia noted. The site of surgery properly noted/marked. The patient was taken to Operating Room # C, identified as [facility]ARRIG[facility] MESSAMORE and the procedure verified as C-Section Delivery. A Time Out was held and the above information confirmed.  After induction of anesthesia, the patient was draped and prepped in the usual sterile manner. A Pfannenstiel incision was made and carried down through the subcutaneous tissue to the fascia. Fascial incision was made and extended transversely using Mayo scissors. The fascia was separated from the underlying rectus tissue superiorly and inferiorly. The peritoneum was identified and entered. Peritoneal incision was extended longitudinally. The utero-vesical peritoneal reflection was incised transversely and the bladder flap was bluntly freed from the lower uterine segment. A low transverse uterine incision(Kerr hysterotomy) was made. Delivered from vertex presentation was a  female with Apgar scores of  pending  at one minute and  pending  at five minutes. Bulb suctioning gently performed. Neonatal team in  attendance.After the umbilical cord was clamped and cut cord blood was obtained for evaluation. The placenta was removed intact and appeared normal. The uterus was curetted with a dry lap pack. Good hemostasis was noted.The uterine outline, tubes and ovaries appeared normal. The uterine incision was closed with running locked sutures of 0 Monocryl x 2 layers. Hemostasis was observed. Lavage was carried out until clear.The parietal peritoneum was closed with a running 2-0 Monocryl suture. The fascia was then reapproximated with running sutures of 0 Monocryl. The skin was reapproximated with 4-0 vicryl after [place] closure with 2-0 plain. Right abdominal wall nevus near incision edge excised using electrocautery.  Instrument, sponge, and needle counts were correct prior the abdominal closure and at the conclusion of the case.   Findings: Preterm living female, post placenta. Nl adnexa  Estimated Blood Loss:  300 mL         Drains: foley                 Specimens: placenta to path                 Complications:  None; patient tolerated the procedure well.         Disposition: PACU - hemodynamically stable.         Condition: stable  Attending Attestation: I performed the procedure.

## 2022-10-04 NOTE — Progress Notes (Signed)
Hospital day #4, severe preeclampsia  Subjective: Patient notes no headache, no vision change.  Patient notes overall feeling fine with good fetal movement.  No bleeding, no contractions, no leakage of fluid.  Patient notes no right upper quadrant pain.  Patient notes finally slept well last night with Vistaril.  Patient notes being woken at 5 AM for blood pressure and the blood pressure was very tight and cause pain and numbness in her arm.  That pain and numbness is gone away the patient thinks that discomfort led to her elevated blood pressures  IV hypertensive protocol started around 6 AM.  Patient needed 20 then 40 then 80 mg of IV labetalol.  She then responded to 10 mg IV hydralazine  Objective: Vitals:   10/04/22 0527 10/04/22 0600 10/04/22 0625 10/04/22 0653  BP: (!) 197/95 (!) 194/102 (!) 181/95 (!) 154/82  Pulse: 67 78 76 90  Resp:      Temp:      TempSrc:      SpO2:      Weight:      Height:        General: Comfortable, no distress Cardiovascular: Regular rate and rhythm Pulmonary: Clear auscultation bilaterally Abdomen: No right upper quadrant pain, fundus nontender Lower extremity: 1+ edema, 2+ DTR, no clonus  10/02/2020 NST: 140, positive accelerations, 15 x 15 accelerations, no decelerations, 10 beat variability toco: None 10/04/2022 NST: 140, +10 x 10 accelerations, no decelerations, 10 beat variability.  Toco none     Latest Ref Rng & Units 10/03/2022   12:37 PM 10/02/2022    7:29 AM 10/01/2022   11:54 AM  CBC  WBC 4.0 - 10.5 K/uL 17.3  15.5  13.3   Hemoglobin 12.0 - 15.0 g/dL 40.9  81.1  91.4   Hematocrit 36.0 - 46.0 % 31.6  31.4  34.0   Platelets 150 - 400 K/uL 456  433  442     CBC    Component Value Date/Time   WBC 17.3 (H) 10/03/2022 1237   RBC 3.59 (L) 10/03/2022 1237   HGB 10.2 (L) 10/03/2022 1237   HCT 31.6 (L) 10/03/2022 1237   PLT 456 (H) 10/03/2022 1237   MCV 88.0 10/03/2022 1237   MCH 28.4 10/03/2022 1237   MCHC 32.3 10/03/2022 1237   RDW  14.5 10/03/2022 1237   LYMPHSABS 2.3 10/01/2022 1154   MONOABS 0.7 10/01/2022 1154   EOSABS 0.1 10/01/2022 1154   BASOSABS 0.1 10/01/2022 1154      Latest Ref Rng & Units 10/03/2022   12:37 PM 10/02/2022    7:29 AM 10/01/2022   11:54 AM  CMP  Glucose 70 - 99 mg/dL 782  956  97   BUN 6 - 20 mg/dL 6  <5  <5   Creatinine 0.44 - 1.00 mg/dL 2.13  0.86  5.78   Sodium 135 - 145 mmol/L 136  132  136   Potassium 3.5 - 5.1 mmol/L 3.4  3.2  3.0   Chloride 98 - 111 mmol/L 106  101  104   CO2 22 - 32 mmol/L Calcium 8.9 - 10.3 mg/dL 8.4  7.0  8.5   Total Protein 6.5 - 8.1 g/dL 5.9  5.8  6.2   Total Bilirubin 0.3 - 1.2 mg/dL 0.4  0.3  0.4   Alkaline Phos 38 - 126 U/L 77  86  97   AST 15 - 41 U/L ALT  0 - 44 U/L Assessment and plan: 37 year old G1 at 30 weeks and 2 days with preeclampsia with severe features by hypertension.  Labs and symptoms have been stable other than mild proteinuria.  Repeat labs pending this morning.  Fetal strip remains reactive.  Likely anxiety component and this is being addressed with addition of BuSpar, Zoloft and Vistaril last night.  Patient has needed several IV hypertensive protocols.  She is currently on labetalol 800 mg every 8 hours, Procardia 30 mg twice daily and just started hydralazine oral this morning.  Will continue to attempt hypertension control.  Patient aware that should this last addition of medication not be effective in controlling her blood pressures she would be a candidate for delivery. -Mode of delivery.  Likely cesarean section due to remote from term -Fetal wellbeing.  Appropriate AFI, appropriate growth.  Steroids given on this admission.  NICU consult completed  Lendon Colonel 10/04/2022 8:27 AM

## 2022-10-04 NOTE — Progress Notes (Signed)
Labile BP requiring inc IV med support in last 24hrs.  BP (!) 156/76   Pulse 92   Temp 98.3 F (36.8 C) (Oral)   Resp 16   Ht  (1.626 m)   Wt 100.2 kg   SpO2 98%   BMI 37.93 kg/m  Primary csection today for PEC with severe features. Pt declines IOL.  Csection posted. Consent done.  Risks of anesthesia , infection, bleeding, injury to surrounding organs with need for repair discussed.

## 2022-10-05 LAB — COMPREHENSIVE METABOLIC PANEL
ALT: 33 U/L (ref 0–44)
AST: 31 U/L (ref 15–41)
Albumin: 2.3 g/dL — ABNORMAL LOW (ref 3.5–5.0)
Alkaline Phosphatase: 83 U/L (ref 38–126)
Anion gap: 11 (ref 5–15)
BUN: 10 mg/dL (ref 6–20)
CO2: 22 mmol/L (ref 22–32)
Calcium: 8 mg/dL — ABNORMAL LOW (ref 8.9–10.3)
Chloride: 102 mmol/L (ref 98–111)
Creatinine, Ser: 0.83 mg/dL (ref 0.44–1.00)
GFR, Estimated: >60 mL/min (ref >60)
Glucose, Bld: 105 mg/dL — ABNORMAL HIGH (ref 70–99)
Potassium: 4.6 mmol/L (ref 3.5–5.1)
Sodium: 135 mmol/L (ref 135–145)
Total Bilirubin: 0.4 mg/dL (ref 0.3–1.2)
Total Protein: 5.4 g/dL — ABNORMAL LOW (ref 6.5–8.1)

## 2022-10-05 LAB — CBC
HCT: 32.5 % — ABNORMAL LOW (ref 36.0–46.0)
Hemoglobin: 10.6 g/dL — ABNORMAL LOW (ref 12.0–15.0)
MCH: 28.4 pg (ref 26.0–34.0)
MCHC: 32.6 g/dL (ref 30.0–36.0)
MCV: 87.1 fL (ref 80.0–100.0)
Platelets: 455 10*3/uL — ABNORMAL HIGH (ref 150–400)
RBC: 3.73 MIL/uL — ABNORMAL LOW (ref 3.87–5.11)
RDW: 14.3 % (ref 11.5–15.5)
WBC: 18.3 10*3/uL — ABNORMAL HIGH (ref 4.0–10.5)
nRBC: 0 % (ref 0.0–0.2)

## 2022-10-05 LAB — RH IG WORKUP (INCLUDES ABO/RH)

## 2022-10-05 NOTE — Plan of Care (Signed)
  Problem: Health Behavior/Discharge Planning: Goal: Ability to manage health-related needs will improve Outcome: Completed/Met   Problem: Clinical Measurements: Goal: Ability to maintain clinical measurements within normal limits will improve Outcome: Completed/Met Goal: Will remain free from infection Outcome: Completed/Met Goal: Diagnostic test results will improve Outcome: Completed/Met Goal: Respiratory complications will improve Outcome: Completed/Met Goal: Cardiovascular complication will be avoided Outcome: Completed/Met   Problem: Activity: Goal: Risk for activity intolerance will decrease Outcome: Completed/Met   Problem: Nutrition: Goal: Adequate nutrition will be maintained Outcome: Completed/Met   Problem: Coping: Goal: Level of anxiety will decrease Outcome: Completed/Met   Problem: Pain Managment: Goal: General experience of comfort will improve Outcome: Completed/Met   Problem: Safety: Goal: Ability to remain free from injury will improve Outcome: Completed/Met   Problem: Skin Integrity: Goal: Risk for impaired skin integrity will decrease Outcome: Completed/Met   Problem: Clinical Measurements: Goal: Complications related to the disease process, condition or treatment will be avoided or minimized Outcome: Completed/Met   Problem: Education: Goal: Knowledge of disease or condition will improve Outcome: Completed/Met Goal: Knowledge of the prescribed therapeutic regimen will improve Outcome: Completed/Met   Problem: Fluid Volume: Goal: Peripheral tissue perfusion will improve Outcome: Completed/Met   Problem: Clinical Measurements: Goal: Complications related to disease process, condition or treatment will be avoided or minimized Outcome: Completed/Met   Problem: Education: Goal: Knowledge of the prescribed therapeutic regimen will improve Outcome: Completed/Met Goal: Understanding of sexual limitations or changes related to disease process  or condition will improve Outcome: Completed/Met Goal: Individualized Educational Video(s) Outcome: Completed/Met   Problem: Self-Concept: Goal: Communication of feelings regarding changes in body function or appearance will improve Outcome: Completed/Met   Problem: Skin Integrity: Goal: Demonstration of wound healing without infection will improve Outcome: Completed/Met

## 2022-10-05 NOTE — Lactation Note (Signed)
This note was copied from a baby's chart. Lactation Consultation Note  Patient Name: Sarah Levy [id number] Date: 10/05/2022 Age:38 hours Reason for consult: Initial assessment;NICU baby;Preterm <34wks;Primapara;Maternal endocrine disorder  LC in to visit with [name] of 30 wk 2 d baby in the NICU.  LC talked with Mom about how lactation could support her pumping for her baby and all the benefits to baby that her milk would provide.  Mom was appreciative, but states for now, she says no to pumping.  Mom is aware of lactation support available and encouraged to ask for LC prn.  Feeding Mother's Current Feeding Choice: Formula   Consult Status Consult Status: Complete    [name] 10/05/2022, 9:45 AM

## 2022-10-05 NOTE — Progress Notes (Signed)
POD# 1 status post primary cesarean section at 30 weeks 4 severe preeclampsia  S: Pt notes pain controlled w/ po meds though patient has not been out of bed.  Minimal lochia, no chest pain, cough or shortness of breath.  No headache, no right upper quadrant pain tol reg po.  Patient notes skin itchiness.  Vitals:   10/05/22 0600 10/05/22 0843 10/05/22 0936 10/05/22 1102  BP:  (!) 141/88    Pulse:  66    Resp: Temp:  97.7 F (36.5 C)    TempSrc:  Oral    SpO2:  97%    Weight:      Height:        Gen: well appearing CV: RRR Pulm: CTAB Abd: soft, ND, approp tender, fundus below umbilicus, NT, no right upper quadrant tenderness Inc: C/D/I,  LE: tr edema, NT, 2+ DTR, no clonus  CBC    Component Value Date/Time   WBC 18.3 (H) 10/05/2022 0457   RBC 3.73 (L) 10/05/2022 0457   HGB 10.6 (L) 10/05/2022 0457   HCT 32.5 (L) 10/05/2022 0457   PLT 455 (H) 10/05/2022 0457   MCV 87.1 10/05/2022 0457   MCH 28.4 10/05/2022 0457   MCHC 32.6 10/05/2022 0457   RDW 14.3 10/05/2022 0457   LYMPHSABS 2.3 10/01/2022 1154   MONOABS 0.7 10/01/2022 1154   EOSABS 0.1 10/01/2022 1154   BASOSABS 0.1 10/01/2022 1154    A/P: POD# 1 s/p primary cesarean section for severe preeclampsia remote preterm at 30 weeks - post-op. Doing well.  Pain management discussed with patient -Severe preeclampsia.  Blood pressures under better control now that she is delivered.  Patient is on labetalol 800 mg every 8 hours and currently on mag sulfate.  Mag is set to go off tonight at around 9 PM.  Will follow blood pressures after that. -Anxiety.  Patient started on 25 mg of Zoloft and also on BuSpar.  She has hydroxyzine as needed.   Lendon Colonel 10/05/2022 11:09 AM

## 2022-10-05 NOTE — Progress Notes (Signed)
Chaplain attempted follow up after delivery of baby yesterday. Pt had a sign on the door requesting rest. Chaplain will try again tomorrow and also try to locate family in baby's room in NICU later today.  Please page as further needs arise.  Maryanna Shape. Carley Hammed, M.Div. [facility] Chaplain Pager [phone] Office [phone]

## 2022-10-06 DIAGNOSIS — O26899 Other specified pregnancy related conditions, unspecified trimester: Secondary | ICD-10-CM

## 2022-10-06 DIAGNOSIS — Z6791 Unspecified blood type, Rh negative: Secondary | ICD-10-CM

## 2022-10-06 DIAGNOSIS — O1490 Unspecified pre-eclampsia, unspecified trimester: Secondary | ICD-10-CM | POA: Diagnosis present

## 2022-10-06 MED ORDER — NIFEDIPINE ER OSMOTIC RELEASE 30 MG PO TB24
30.0000 mg | ORAL_TABLET | Freq: Every day | ORAL | Status: DC
  Administered 2022-10-06: 30 mg via ORAL
  Filled 2022-10-06: qty 1

## 2022-10-06 NOTE — Progress Notes (Signed)
      Subjective: POD# 2 Live born female  Birth Weight: 3 lb 1.4 oz (1400 g) APGAR: 6, 8  Newborn Delivery   Birth date/time: 10/04/2022 19:17:00 Delivery type: C-Section, Low Transverse Trial of labor: No C-section categorization: Primary    NICU admit / prematurity Baby name: undecided Delivering provider: Olivia Mackie   Feeding: pumping  Pain control at delivery: Spinal   Reports feeling well, denies PEC neural sx.   Patient reports tolerating PO.   Breast symptoms:none Pain controlled with ibuprofen (OTC) and narcotic analgesics including oxycodone Denies HA/SOB/C/P/N/V/dizziness. Flatus present. She reports vaginal bleeding as normal, without clots.  She is ambulating, urinating without difficulty.     Objective:   VS:    Vitals:   10/05/22 2000 10/05/22 2110 10/06/22 0001 10/06/22 0632  BP: (!) 151/82  (!) 143/79 (!) 152/86  Pulse: 73  77 79  Resp: Temp: 97.7 F (36.5 C)  97.9 F (36.6 C) 98.1 F (36.7 C)  TempSrc: Oral  Oral Oral  SpO2: 98%  98% 100%  Weight:      Height:         Intake/Output Summary (Last 24 hours) at 10/06/2022 0746 Last data filed at 10/06/2022 1610 Gross per 24 hour  Intake 2406.07 ml  Output 2350 ml  Net 56.07 ml        Recent Labs    10/04/22 0747 10/05/22 0457  WBC 15.3* 18.3*  HGB 10.7* 10.6*  HCT 33.2* 32.5*  PLT 458* 455*     Blood type: --/--/B NEG (04/22 1245)  Rubella:   immune    Physical Exam:  General: alert, cooperative, and no distress CV: Regular rate and rhythm Resp: clear Abdomen: soft, nontender, normal bowel sounds Incision: clean, dry, and intact Uterine Fundus: firm, below umbilicus, nontender Lochia: minimal Ext: edema +1 pedal, no cords or tenderness  Assessment/Plan: 37 y.o.   POD# 2. [id number]                  Principal Problem:   Postpartum care following cesarean delivery 4/22 Active Problems:   Hypokalemia  - resolved   Status post primary low transverse  cesarean section   Preeclampsia  - completed Mag Sulfate 24 hrs last night, labs stable, asymptomatic  - BP to mild range on labetalol 800 mg TID  - will continue to monitor, add Procardia 30 XL if continues elevated BP today   Rh negative, maternal / newborn Rh negative   Anxiety  - stable on Zoloft and BuSpar  Doing well, stable.               Advance diet as tolerated Breastfeeding support Encourage to ambulate Routine post-op care  POC in consult w/ Dr. Alvin Critchley, CNM, MSN 10/06/2022, 7:46 AM

## 2022-10-07 LAB — COMPREHENSIVE METABOLIC PANEL
ALT: 29 U/L (ref 0–44)
AST: 22 U/L (ref 15–41)
Albumin: 2.4 g/dL — ABNORMAL LOW (ref 3.5–5.0)
Alkaline Phosphatase: 85 U/L (ref 38–126)
Anion gap: 9 (ref 5–15)
BUN: 7 mg/dL (ref 6–20)
CO2: 25 mmol/L (ref 22–32)
Calcium: 8.7 mg/dL — ABNORMAL LOW (ref 8.9–10.3)
Chloride: 105 mmol/L (ref 98–111)
Creatinine, Ser: 0.8 mg/dL (ref 0.44–1.00)
GFR, Estimated: >60 mL/min (ref >60)
Glucose, Bld: 89 mg/dL (ref 70–99)
Potassium: 4.5 mmol/L (ref 3.5–5.1)
Sodium: 139 mmol/L (ref 135–145)
Total Bilirubin: 0.3 mg/dL (ref 0.3–1.2)
Total Protein: 5.8 g/dL — ABNORMAL LOW (ref 6.5–8.1)

## 2022-10-07 LAB — CBC
HCT: 30.5 % — ABNORMAL LOW (ref 36.0–46.0)
Hemoglobin: 9.9 g/dL — ABNORMAL LOW (ref 12.0–15.0)
MCH: 28.7 pg (ref 26.0–34.0)
MCHC: 32.5 g/dL (ref 30.0–36.0)
MCV: 88.4 fL (ref 80.0–100.0)
Platelets: 438 10*3/uL — ABNORMAL HIGH (ref 150–400)
RBC: 3.45 MIL/uL — ABNORMAL LOW (ref 3.87–5.11)
RDW: 14.5 % (ref 11.5–15.5)
WBC: 12.3 10*3/uL — ABNORMAL HIGH (ref 4.0–10.5)
nRBC: 0 % (ref 0.0–0.2)

## 2022-10-07 LAB — SURGICAL PATHOLOGY

## 2022-10-07 MED ORDER — NIFEDIPINE ER OSMOTIC RELEASE 60 MG PO TB24
60.0000 mg | ORAL_TABLET | Freq: Two times a day (BID) | ORAL | Status: DC
  Administered 2022-10-07 – 2022-10-08 (×3): 60 mg via ORAL
  Filled 2022-10-07 (×3): qty 1

## 2022-10-07 MED ORDER — BUSPIRONE HCL 10 MG PO TABS: 10.0000 mg | ORAL_TABLET | Freq: Two times a day (BID) | ORAL | Status: DC | PRN

## 2022-10-07 NOTE — Progress Notes (Signed)
Chaplain follow up visit. Launi had her baby a couple of days ago. Chaplain has made contact with baby, but Brindy has had a resting sign on her door. She was in her hospital bed with partner at her side. Chaplain asked open ended questions to facilitate emotional expression and story telling. Roshawnda reports she is beginning to feel more like herself. Over the last couple of days the medications have made her so drowsy it has been difficult to stay awake. She has been frustrated that her blood pressure is still periodically high because she is very eager to go to the NICU to see the baby. They acknowledge that discharge without Adolm Joseph will likely be difficult, but Primrose is finding some comfort in using the time to the nursery ready and spend time with their son.   Will continue to follow throughout NICU stay.  Please page as further needs arise.  Maryanna Shape. Carley Hammed, M.Div. [facility] Chaplain Pager [phone] Office [phone]     10/07/22 1233  Spiritual Encounters  Type of Visit Follow up  Care provided to: Pt and family  Referral source Chaplain assessment  Reason for visit Routine spiritual support  Spiritual Framework  Presenting Themes Goals in life/care;Significant life change;Impactful experiences and emotions  Community/Connection Family;Significant other  Patient Stress Factors Loss of control  Interventions  Spiritual Care Interventions Made Compassionate presence;Reflective listening;Normalization of emotions;Meaning making  Intervention Outcomes  Outcomes Connection to spiritual care;Connection to values and goals of care;Autonomy/agency;Awareness of support

## 2022-10-07 NOTE — Progress Notes (Signed)
      Subjective: POD# 3 Live born female  Birth Weight: 3 lb 1.4 oz (1400 g) APGAR: 6, 8  Newborn Delivery   Birth date/time: 10/04/2022 19:17:00 Delivery type: C-Section, Low Transverse Trial of labor: No C-section categorization: Primary    NICU admit / prematurity Baby name: undecided Delivering provider: Olivia Mackie   Feeding: has tried some pumping but mostly using donor milk  Pain control at delivery: Spinal   Reports feeling well, denies PEC neural sx.   Patient reports tolerating PO.   Breast symptoms:none Pain controlled with ibuprofen (OTC) and narcotic analgesics including oxycodone Denies HA/SOB/C/P/N/V/dizziness. Flatus present. She reports vaginal bleeding as normal, without clots.  She is ambulating, urinating without difficulty.     Objective:   VS:    Vitals:   10/07/22 0612 10/07/22 0633 10/07/22 0806 10/07/22 1216  BP: (!) 161/92 (!) 149/84 (!) 140/78 (!) 152/79  Pulse:   72 76  Resp:   16 16  Temp:   98 F (36.7 C) 98.1 F (36.7 C)  TempSrc:   Oral Oral  SpO2:   100% 100%  Weight:      Height:         Intake/Output Summary (Last 24 hours) at 10/07/2022 1335 Last data filed at 10/07/2022 0345 Gross per 24 hour  Intake 360 ml  Output 2500 ml  Net -2140 ml         Recent Labs    10/05/22 0457 10/07/22 0516  WBC 18.3* 12.3*  HGB 10.6* 9.9*  HCT 32.5* 30.5*  PLT 455* 438*      Blood type: --/--/B NEG (04/22 1245)  Rubella:   immune    Physical Exam:  General: alert, cooperative, and no distress CV: Regular rate and rhythm Resp: clear Abdomen: soft, nontender, normal bowel sounds Incision: clean, dry, and intact Uterine Fundus: firm, below umbilicus, nontender Lochia: minimal Ext: edema +1 pedal, no cords or tenderness  Assessment/Plan: 37 y.o.   POD# 3 G1P0101                  Principal Problem:   Postpartum care following cesarean delivery 4/22 Active Problems:   Hypokalemia  - resolved   Status post primary  low transverse cesarean section   Preeclampsia  - s/p 24hr PP Mag, labs stable, asymptomatic  - BP poorly controlled on Labetalol 800 mg TID, Procardia 30XL added last night, patient continued to have severe range pressures requiring IV protocol. Procardia increased to 60XL BID this morning, continue Labetalol 800 mg TID and cont close BP monitoring, adjust regimen as needed   Rh negative, maternal / newborn Rh negative   Anxiety  - stable on Zoloft, discussed increased anxiety at night possibly contributing to BP, added back Buspar 10 mg BID PRN anxiety  Doing well, stable.               Advance diet as tolerated Breastfeeding support Encourage to ambulate Routine post-op care  Will remain inpatient for BP control and patient agrees   Toy Baker, DO 10/07/2022, 1:35 PM

## 2022-10-08 MED ORDER — NIFEDIPINE ER 60 MG PO TB24: 60.0000 mg | ORAL_TABLET | Freq: Two times a day (BID) | ORAL | 0 refills | Status: DC

## 2022-10-08 MED ORDER — PRENATAL MULTIVITAMIN CH: 1.0000 | ORAL_TABLET | Freq: Every day | ORAL | Status: AC

## 2022-10-08 MED ORDER — IBUPROFEN 600 MG PO TABS: 600.0000 mg | ORAL_TABLET | Freq: Four times a day (QID) | ORAL | 0 refills | Status: AC

## 2022-10-08 MED ORDER — OXYCODONE HCL 5 MG PO TABS: 5.0000 mg | ORAL_TABLET | ORAL | 0 refills | Status: DC | PRN

## 2022-10-08 MED ORDER — SERTRALINE HCL 25 MG PO TABS: 25.0000 mg | ORAL_TABLET | Freq: Every day | ORAL | 0 refills | Status: AC

## 2022-10-08 MED ORDER — LABETALOL HCL 200 MG PO TABS: 800.0000 mg | ORAL_TABLET | Freq: Three times a day (TID) | ORAL | 0 refills | Status: AC

## 2022-10-08 MED ORDER — BUSPIRONE HCL 10 MG PO TABS: 10.0000 mg | ORAL_TABLET | Freq: Two times a day (BID) | ORAL | 0 refills | Status: AC | PRN

## 2022-10-08 NOTE — Discharge Summary (Signed)
Postpartum Discharge Summary   Patient Name: Sarah Levy DOB: June 12, 1986 MRN: [id number]  Date of admission: 10/01/2022 Delivery date:10/04/2022  Delivering provider: Olivia Mackie  Date of discharge: 10/08/2022  Admitting diagnosis: Hypertension affecting pregnancy [O16.9]  [redacted]w[redacted]d      Secondary diagnosis:  Preeclampsia Rh negative      Discharge diagnosis: Poorly controlled HTN with emergency preterm delivery on 10/04/22 by emergency Low Transverse  C-section                                                Post partum procedures: Magnesium post delivery  Augmentation: N/A Complications: None  Hospital course:  37 y.o. female presenting for admission for BP management and possible indicated preterm birth. Pt with history of reported white coat htn now with severe range BPs and started on PO labetol 2w ago. Dose currently 400mg  tid. Was admitted on 4/8 for BP control and hypokalemia with improvement and dc after IV repletion and PO meds for htn. Denies HAs , visual changes or epigastric pain. CBC and CMP nl on 4/17. Urine PC ration nl last wk but on 4/17 now inc from 0.2 to 2.1. Reports good FM and denies contractions. Sono AGA with nl afi and BPP 8/8 on 4/17. Admitted for further evaluation and possible delivery.   Over the course of her stay in antepartum baby was monitored with NST TID. BP controlled with Increasing Labetalol to 800 mg TID and add Procardia 30 mg XL BID. Buspar 10 mg BID and Zoloft 25 mg daily started for presumed anxiety/  Emergency  Low transverse C/section on 10/04/22 for worsening severe range BPs. Then postpartum magnesium for seizure prophylaxis BP meds titrated up to Procardia 60mg  XL BID and Labetalol 800mg  TID. Anxiety meds- Buspar 10mg  bid and Zoloft 25 mg daily   Magnesium Sulfate received: Yes: Neuroprotection and repeat postpartum for seizure prophylaxis  BMZ received: Yes Rhophylac:N/A MMR:No T-DaP:Given prenatally Flu:  N/A Transfusion:No  Physical exam  Vitals:   10/08/22 0742 10/08/22 1117 10/08/22 1455 10/08/22 1456  BP: 121/61 (!) 141/79  136/77  Pulse: 76 95  80  Resp: 18 18  18   Temp: 97.6 F (36.4 C) 98.3 F (36.8 C)  98 F (36.7 C)  TempSrc: Oral Oral  Oral  SpO2: 98% 95% 95% 95%  Weight:      Height:       General: alert, cooperative, and no distress Lochia: appropriate Lungs CTA CV RRR Uterine Fundus: soft Incision: Healing well with no significant drainage DVT Evaluation: No evidence of DVT seen on physical exam. Negative Homan's sign. Labs: Lab Results  Component Value Date   WBC 12.3 (H) 10/07/2022   HGB 9.9 (L) 10/07/2022   HCT 30.5 (L) 10/07/2022   MCV 88.4 10/07/2022   PLT 438 (H) 10/07/2022      Latest Ref Rng & Units 10/07/2022    5:16 AM  CMP  Glucose 70 - 99 mg/dL 89   BUN 6 - 20 mg/dL 7   Creatinine 4.09 - 8.11 mg/dL 9.14   Sodium 782 - 956 mmol/L 139   Potassium 3.5 - 5.1 mmol/L 4.5   Chloride 98 - 111 mmol/L 105   CO2 22 - 32 mmol/L 25   Calcium 8.9 - 10.3 mg/dL 8.7   Total Protein 6.5 - 8.1 g/dL 5.8   Total  Bilirubin 0.3 - 1.2 mg/dL 0.3   Alkaline Phos 38 - 126 U/L 85   AST 15 - 41 U/L 22   ALT 0 - 44 U/L 29    Edinburgh Score:    10/05/2022   12:14 PM  Edinburgh Postnatal Depression Scale Screening Tool  I have been able to laugh and see the funny side of things. 0  I have looked forward with enjoyment to things. 0  I have blamed myself unnecessarily when things went wrong. 2  I have been anxious or worried for no good reason. 0  I have felt scared or panicky for no good reason. 0  Things have been getting on top of me. 2  I have been so unhappy that I have had difficulty sleeping. 0  I have felt sad or miserable. 0  I have been so unhappy that I have been crying. 0  The thought of harming myself has occurred to me. 0  Edinburgh Postnatal Depression Scale Total 4      After visit meds:  Allergies as of 10/08/2022       Reactions    Sulfa Antibiotics    Feels like throat is closing   Tylenol [acetaminophen]    Itchy throat, feels like it is closing        Medication List     TAKE these medications    albuterol 108 (90 Base) MCG/ACT inhaler Commonly known as: VENTOLIN HFA Inhale 1-2 puffs into the lungs every 6 (six) hours as needed for wheezing or shortness of breath.   busPIRone 10 MG tablet Commonly known as: BUSPAR Take 1 tablet (10 mg total) by mouth 2 (two) times daily as needed (anxiety).   cetirizine 5 MG tablet Commonly known as: ZYRTEC Take 10 mg by mouth daily.   cholecalciferol 25 MCG (1000 UNIT) tablet Commonly known as: VITAMIN D3 Take 1,000 Units by mouth daily.   ibuprofen 600 MG tablet Commonly known as: ADVIL Take 1 tablet (600 mg total) by mouth every 6 (six) hours.   labetalol 200 MG tablet Commonly known as: NORMODYNE Take 4 tablets (800 mg total) by mouth every 8 (eight) hours. What changed:  how much to take when to take this   levothyroxine 88 MCG tablet Commonly known as: SYNTHROID Take 88 mcg by mouth daily before breakfast.   Magnesium 400 MG Tabs Take 400 mg by mouth daily.   multivitamin-prenatal 27-0.8 MG Tabs tablet Take 1 tablet by mouth daily at 12 noon.   NIFEdipine 60 MG 24 hr tablet Commonly known as: ADALAT CC Take 1 tablet (60 mg total) by mouth 2 (two) times daily.   oxyCODONE 5 MG immediate release tablet Commonly known as: Oxy IR/ROXICODONE Take 1 tablet (5 mg total) by mouth every 4 (four) hours as needed for moderate pain.   pantoprazole 40 MG tablet Commonly known as: PROTONIX Take 40 mg by mouth daily.   potassium chloride 10 MEQ tablet Commonly known as: KLOR-CON Take 40 mEq by mouth daily.   prenatal multivitamin Tabs tablet Take 1 tablet by mouth daily at 12 noon. Start taking on: Sarah 27, 2024   sertraline 25 MG tablet Commonly known as: ZOLOFT Take 1 tablet (25 mg total) by mouth daily. Start taking on: Sarah 27, 2024          Discharge home in stable condition Infant Feeding: Bottle in NICU Infant Disposition:NICU Discharge instruction: per After Visit Summary and Postpartum booklet. Activity: Advance as tolerated. Pelvic rest for 6  weeks.  Diet: routine diet Anticipated Birth Control: Unsure Postpartum Appointment:2-3 days Additional Postpartum F/U: BP check 2-3 days Future Appointments:No future appointments. Follow up Visit: Pt to schedule to see Dr Billy Coast on 4/29 or 4/30  Warning ss and after -hr service dw pt     10/08/2022 Robley Fries, MD

## 2022-10-08 NOTE — Clinical Social Work Maternal (Signed)
CLINICAL SOCIAL WORK MATERNAL/CHILD NOTE  Patient Details  Name: Sarah Levy MRN: [id number] Date of Birth: September 01, 1985  Date:  10/08/2022  Clinical Social Worker Initiating Note:  Celso Sickle, [place] Date/Time: Initiated:  10/08/22/1429     Child's Name:  Sarah Levy   Biological Parents:  Mother, Other (Comment) (Mother: Kleo Dungee)   Need for Interpreter:  None   Reason for Referral:  Parental Support of Premature Babies < 32 weeks/or Critically Ill babies   Address:  [address] [place] [address]    Phone number:  [phone] (home)     Additional phone number:   Household Members/Support Persons (HM/SP):   Household Member/Support Person 1, Household Member/Support Person 2   HM/SP Name Relationship DOB or Age  HM/SP -1 Kelly Duchemin MOB    HM/SP -2 Daelyn Pettaway son 08/31/17  HM/SP -3        HM/SP -4        HM/SP -5        HM/SP -6        HM/SP -7        HM/SP -8          Natural Supports (not living in the home):  Friends, Extended Family, Immediate Family, Parent   Professional Supports: None   Employment: Full-time   Type of Work: Conservation officer, historic buildings   Education:  Engineer, maintenance (IT)   Homebound arranged:    Surveyor, quantity Resources:  Media planner    Other Resources:      Cultural/Religious Considerations Which May Impact Care:    Strengths:  Ability to meet basic needs  , Home prepared for child  , Understanding of illness, Pediatrician chosen, Psychotropic Medications   Psychotropic Medications:  Buspar      Pediatrician:    Armed forces operational officer area  Pediatrician List:   [facility] [employer] [facility]    [facility]      Pediatrician Fax Number:    Risk Factors/Current Problems:  None   Cognitive State:  Able to Concentrate  , Alert  , Goal Oriented  , Linear Thinking     Mood/Affect:  Calm  , Happy  , Relaxed  , Comfortable      CSW Assessment: CSW met with MOB at bedside to complete psychosocial assessment, MOB [name]) present. CSW introduced self and explained role. MOB was welcoming, pleasant, and remained engaged during assessment. MOB reported that MOB's [name]) sister would be coming in and granted CSW verbal permission to speak about anything in front MOB's sister. MOB reported that they reside together along with their 38 year old son. Parents reported having all items needed to care for infant including a car seat, bassinet, and crib. CSW inquired about parents support system, parent's reported that they have a big network of support and mentioned MOB's [name]) sister and mom as specific supports.   CSW provided review of Sudden Infant Death Syndrome (SIDS) precautions.    CSW inquired about MOB's mental health history. MOB denied any mental health history. MOB's chart mentioned a history of anxiety and a prescription for Zoloft and Buspar. CSW inquired about how MOB was feeling emotionally since giving birth, MOB reported that she was feeling tired but good. MOB presented calm and did not demonstrate any acute mental health signs/symptoms. CSW assessed for safety, MOB denied SI and HI. CSW did not assess for domestic violence as MOB was not alone.  CSW provided education regarding the baby blues period vs. perinatal mood disorders, discussed treatment and gave resources for mental health follow up if concerns arise.  CSW recommends self-evaluation during the postpartum time period using the New Mom Checklist from Postpartum Progress and encouraged MOB to contact a medical professional if symptoms are noted at any time.    MOB's sister entered the room, CSW greeted MOB's sister.   CSW and parents discussed infant's NICU admission. CSW informed parents about the NICU, what to expect, and supports available while infant is admitted to the NICU. Parents reported that they feel well informed about infant's care and  denied any transportation barriers with visiting infant in the NICU. Parents denied any questions/concerns regarding the NICU.   CSW identifies no further need for intervention and no barriers to discharge at this time. MOB opted to contact CSW if any needs arise versus CSW checking in weekly.    CSW Plan/Description:  Sudden Infant Death Syndrome (SIDS) Education, Perinatal Mood and Anxiety Disorder (PMADs) Education, No Further Intervention Required/No Barriers to Discharge, Other Patient/Family Education    Antionette Poles, LCSW 10/08/2022, 2:32 PM

## 2022-10-08 NOTE — Plan of Care (Signed)
  Problem: Education: Goal: Knowledge of disease or condition will improve Outcome: Adequate for Discharge Goal: Knowledge of the prescribed therapeutic regimen will improve Outcome: Adequate for Discharge Goal: Individualized Educational Video(s) Outcome: Adequate for Discharge   Problem: Education: Goal: Knowledge of condition will improve Outcome: Adequate for Discharge Goal: Individualized Educational Video(s) Outcome: Adequate for Discharge Goal: Individualized Newborn Educational Video(s) Outcome: Adequate for Discharge   Problem: Activity: Goal: Will verbalize the importance of balancing activity with adequate rest periods Outcome: Adequate for Discharge Goal: Ability to tolerate increased activity will improve Outcome: Adequate for Discharge   Problem: Coping: Goal: Ability to identify and utilize available resources and services will improve Outcome: Adequate for Discharge   Problem: Life Cycle: Goal: Chance of risk for complications during the postpartum period will decrease Outcome: Adequate for Discharge   Problem: Role Relationship: Goal: Ability to demonstrate positive interaction with newborn will improve Outcome: Adequate for Discharge   Problem: Skin Integrity: Goal: Demonstration of wound healing without infection will improve Outcome: Adequate for Discharge

## 2022-10-08 NOTE — Progress Notes (Signed)
Subjective: POD# 4 Live born female  Birth Weight: 3 lb 1.4 oz (1400 g) APGAR: 6, 8 Low transverse C-section -Dr  Olivia Mackie  Emerg C/s for severe range BPs   Patient reports tolerating PO.   Breast symptoms:none Pain controlled with ibuprofen (OTC) and narcotic analgesics including oxycodone Denies HA/SOB/C/P/N/V/dizziness. Flatus present. She reports vaginal bleeding as normal, without clots.  She is ambulating, urinating without difficulty.     Objective:   VS: BP (!) 141/79 (BP Location: Right Arm)   Pulse 95   Temp 98.3 F (36.8 C) (Oral)   Resp 18   Ht 5\' 4"  (1.626 m)   Wt 100.2 kg   SpO2 95%   Breastfeeding Unknown   BMI 37.93 kg/m   Patient Vitals for the past 24 hrs:  BP Temp Temp src Pulse Resp SpO2  10/08/22 1117 (!) 141/79 98.3 F (36.8 C) Oral 95 18 95 %  10/08/22 0742 121/61 97.6 F (36.4 C) Oral 76 18 98 %  10/08/22 0523 (!) 159/68 98.4 F (36.9 C) Oral 79 18 98 %  10/08/22 0014 118/63 98.2 F (36.8 C) Oral 79 16 96 %  10/07/22 2222 (!) 156/87 98.3 F (36.8 C) Oral 83 16 97 %  10/07/22 1525 (!) 159/88 98.1 F (36.7 C) Oral 78 16 96 %        Latest Ref Rng & Units 10/07/2022    5:16 AM 10/05/2022    4:57 AM 10/04/2022    7:47 AM  CBC  WBC 4.0 - 10.5 K/uL 12.3  18.3  15.3   Hemoglobin 12.0 - 15.0 g/dL 9.9  16.1  09.6   Hematocrit 36.0 - 46.0 % 30.5  32.5  33.2   Platelets 150 - 400 K/uL 438  455  458        Latest Ref Rng & Units 10/07/2022    5:16 AM 10/05/2022    4:57 AM 10/04/2022    7:47 AM  CMP  Glucose 70 - 99 mg/dL 89  045  409   BUN 6 - 20 mg/dL 7  10  8    Creatinine 0.44 - 1.00 mg/dL 8.11  9.14  7.82   Sodium 135 - 145 mmol/L 139  135  138   Potassium 3.5 - 5.1 mmol/L 4.5  4.6  3.7   Chloride 98 - 111 mmol/L 105  102  109   CO2 22 - 32 mmol/L 25  22  20    Calcium 8.9 - 10.3 mg/dL 8.7  8.0  8.7   Total Protein 6.5 - 8.1 g/dL 5.8  5.4  6.7   Total Bilirubin 0.3 - 1.2 mg/dL 0.3  0.4  0.6   Alkaline Phos 38 - 126 U/L 85  83  45    AST 15 - 41 U/L 22  31  26    ALT 0 - 44 U/L 29  33  29        Blood type: --/--/B NEG (04/22 1245)  Rubella:   immune    Physical Exam:  General: alert, cooperative, and no distress CV: Regular rate and rhythm Resp: clear Abdomen: soft, nontender, normal bowel sounds Incision: clean, dry, and intact Uterine Fundus: firm, below umbilicus, nontender Lochia: minimal Ext: edema +1 pedal, no cords or tenderness  Assessment/Plan: 37 y.o.   POD# 3 G1P0101                  Principal Problem:   Postpartum care following cesarean delivery 4/22 Active Problems: Hypokalemia- -  resolved Preeclampsia - 24hr Mag, labs stable, asymptomatic BP better- on Labetalol 800 mg TID, Procardia 60 XL BID, close BP monitoring Rh negative- newborn Rh negative Anxiety - stable on Zoloft, Buspar.  We can discharge today. F/up with Dr Billy Coast in office on 4/29 or 4/30  Robley Fries, MD 10/08/2022, 3:18 PM

## 2022-10-09 ENCOUNTER — Ambulatory Visit (HOSPITAL_COMMUNITY): Payer: Self-pay

## 2022-10-09 NOTE — Lactation Note (Signed)
This note was copied from a baby's chart.  NICU Lactation Consultation Note  Patient Name: [name] [id number] Date: 10/09/2022 Age:38 days  Reason for consult: Follow-up assessment; NICU baby; 1st time breastfeeding; Primapara; Maternal endocrine disorder; Mother's request; Other (Comment); Infant < 6lbs; Preterm <34wks; Exclusive pumping and bottle feeding (AMA) Type of Endocrine Disorder?: Thyroid (hypothyroid)  SUBJECTIVE Visited with family of 71 5/22 weeks old AGA NICU female for 5 days post-partum F/U and per maternal request, she has decided to give pumping another chance. Ms. Sarah Levy is a P1 and reports she tried pumping once while at the hospital but that "nothing came out" so she decided to do donor milk instead. She started pumping at home the last two days and already getting small volumes, praised her for her efforts. Her current plan is to exclusively pumping and bottle feed. Ms. Sarah Levy wife is very supportive, they share a 84 y.o son. Her wife voiced she breastfeed their son for 11 months; he was a term baby. Parents had questions regarding supply. Reviewed pumping schedule, lactogenesis II/III, STS care, benefits of premature milk and anticipatory guidelines.   OBJECTIVE Infant data: Mother's Current Feeding Choice: Breast Milk and Donor Milk  Maternal data: G1P0101  C-Section, Low Transverse Has patient been taught Hand Expression?: No Hand Expression Comments: patients feels comfortable with just pumping Significant Breast History:: (+) breast changes during the pregnancy Current breast feeding challenges:: NICU admission Does the patient have breastfeeding experience prior to this delivery?: No Pumping frequency: 5 times/24 horus Pumped volume: 15 mL Flange Size: 24 Risk factor for low milk supply:: primipara, prematurity, infant separation  Pump: Personal (Baby Budda DEBP at home)  ASSESSMENT Infant: Feeding Status: Continuous gastric  feedings  Maternal: Milk volume: Low  INTERVENTIONS/PLAN Interventions: Interventions: Breast feeding basics reviewed; DEBP; Coconut oil; Education; [employer] brochure Discharge Education: Engorgement and breast care Tools: Pump; Flanges Pump Education: Setup, frequency, and cleaning; Milk Storage  Plan: Encouraged pumping every 3 hours, ideally 8 pumping sessions/24 hours Parents will do as much STS with baby as they can  Ms. Sarah Levy's wife present and very supportive. All questions and concerns answered, family to contact [facility] services PRN.  Consult Status: NICU follow-up NICU Follow-up type: Weekly NICU follow up; Verify absence of engorgement   Levelle Edelen S Claretta Kendra 10/09/2022, 6:05 PM

## 2022-10-11 ENCOUNTER — Encounter (HOSPITAL_COMMUNITY): Payer: Self-pay | Admitting: Obstetrics and Gynecology

## 2022-10-13 DIAGNOSIS — O1415 Severe pre-eclampsia, complicating the puerperium: Secondary | ICD-10-CM | POA: Diagnosis not present

## 2022-10-16 ENCOUNTER — Ambulatory Visit (HOSPITAL_COMMUNITY): Payer: Self-pay

## 2022-10-16 NOTE — Lactation Note (Signed)
This note was copied from a baby's chart.  NICU Lactation Consultation Note  Patient Name: Sarah Levy [id number] Date: 10/16/2022 Age:38 days  Reason for consult: Weekly NICU follow-up; 1st time breastfeeding; Primapara; NICU baby; Other (Comment); Exclusive pumping and bottle feeding; Preterm <34wks; Maternal endocrine disorder (AMA) Type of Endocrine Disorder?: Thyroid (Hypothyroid)  SUBJECTIVE Visited with family of 38 16/28 weeks old AGA NICU female; Sarah Levy is a P1 and reports pumping is going well, her supply continues to slowly increase, praised her for her efforts. Her plan is to exclusively pumping and bottle feed. She was pumping when entered the room but noticed that she was using her personal pump instead of the hospital grade one. Family explained that they didn't have bottles for the pump kit, showed them how the standard bottle size we use for storing can also fit on the pump kit. Reviewed pumping schedule, lactogenesis III, strategies to increase supply and anticipatory guidelines.   OBJECTIVE Infant data: Mother's Current Feeding Choice: Breast Milk  Maternal data: G1P0101  C-Section, Low Transverse Pumping frequency: 7 times/24 hours Pumped volume: 25 mL (25-30 ml) Flange Size: 24  Pump: Personal (Baby Budda DEBP at home)  ASSESSMENT Infant: Feeding Status: Scheduled 9-12-3-6  Maternal: Milk volume: Low  INTERVENTIONS/PLAN Interventions: Interventions: Breast feeding basics reviewed; DEBP; Education; Coconut oil Tools: Pump; Flanges Pump Education: Setup, frequency, and cleaning; Milk Storage  Plan: Encouraged pumping every 3 hours, ideally 8 pumping sessions/24 hours Parents will do as much STS with baby as they can She'll start power pumping in the AM   Ms. Lodes's wife Sarah Levy) and big brother present and very supportive. All questions and concerns answered, family to contact [facility] services PRN.   Consult Status: NICU follow-up NICU Follow-up  type: Weekly NICU follow up   Darryn Kydd S Philis Nettle 10/16/2022, 11:29 AM

## 2022-10-27 DIAGNOSIS — E039 Hypothyroidism, unspecified: Secondary | ICD-10-CM | POA: Diagnosis not present

## 2022-10-27 DIAGNOSIS — O1415 Severe pre-eclampsia, complicating the puerperium: Secondary | ICD-10-CM | POA: Diagnosis not present

## 2022-10-27 DIAGNOSIS — E876 Hypokalemia: Secondary | ICD-10-CM | POA: Diagnosis not present

## 2022-10-28 ENCOUNTER — Ambulatory Visit (HOSPITAL_COMMUNITY): Payer: Self-pay

## 2022-10-28 NOTE — Lactation Note (Signed)
This note was copied from a baby's chart.  NICU Lactation Consultation Note  Patient Name: Sarah Levy ZOXWR'U Date: 10/28/2022 Age:38 wk.o.  Reason for consult: Weekly NICU follow-up; 1st time breastfeeding; Primapara; NICU baby; Other (Comment); Exclusive pumping and bottle feeding; Preterm <34wks; Maternal endocrine disorder; Infant < 6lbs; Mother's request (AMA) Type of Endocrine Disorder?: Thyroid (hypothyroid)  SUBJECTIVE Visited with family of 33 5/7 AGA NICU female; Sarah Levy is a P1 and asked for lactation because her nipples were sore and she had a "knot" on her L breast. She voiced she had another "knot" just like it on the opposite side yesterday but it went away after pumping. She was concerned that her supply went down to 30 ml yesterday but it started coming back again this morning (see Maternal assessment). Noticed she was leaking on both sides; she said "I'm due for pumping now". Encouraged her to pump before she leaves the unit, even if it's for 5-10 minutes, explained the importance of consistent milk removal to protect her supply. Let her know that her starting time is her actual "pumping time" and not the time where she finishes pumping.  When reviewing her flange sizes for her Baby Budda breastpump (she doesn't use the Symphony pump in baby's room) noticed they don't have a size, but she is a # 21 in our Medela flanges, she'll need inserts or a smaller flange size for her pump at home. Reviewed lactogenesis III, pumping schedule, strategies to increase supply and anticipatory guidelines.  OBJECTIVE Infant data: Mother's Current Feeding Choice: Breast Milk  Infant feeding assessment Scale for Readiness: 3   Maternal data: G1P0101  C-Section, Low Transverse Pumping frequency: 7-8 times/24 hours Pumped volume: 45 mL (45-60) Flange Size: 24  Pump: Personal (Baby Budda DEBP at home)  ASSESSMENT Infant: Feeding Status: Scheduled 9-12-3-6  Maternal: Milk  volume: Low Both nipples intact upon examination L side had small "knots" around 9 O'clock that were tender to touch and pressure  INTERVENTIONS/PLAN Interventions: Interventions: Breast feeding basics reviewed; DEBP; Education Tools: Pump; Flanges; Coconut oil Pump Education: Setup, frequency, and cleaning; Milk Storage  Plan: Encouraged pumping every 3 hours, ideally 8 pumping sessions/24 hours She'll try smaller flanges/inserts for her pump at home She'll ice her breasts PRN She'll try power pumping in the AM, especially if she goes 4-6 hours without pumping at night   No other support person at this time All questions and concerns answered, family to contact [facility] services PRN.  Consult Status: NICU follow-up NICU Follow-up type: Weekly NICU follow up   Bradey Luzier S Philis Nettle 10/28/2022, 11:40 AM

## 2022-11-10 DIAGNOSIS — O135 Gestational [pregnancy-induced] hypertension without significant proteinuria, complicating the puerperium: Secondary | ICD-10-CM | POA: Diagnosis not present

## 2022-11-12 ENCOUNTER — Ambulatory Visit (HOSPITAL_COMMUNITY): Payer: Self-pay

## 2022-11-12 NOTE — Lactation Note (Signed)
This note was copied from a baby's chart.  NICU Lactation Consultation Note  Patient Name: Sarah Levy ZOXWR'U Date: 11/12/2022 Age:38 wk.o.  Reason for consult: Follow-up assessment; NICU baby; Weekly NICU follow-up; Primapara; 1st time breastfeeding; Late-preterm 34-36.6wks; Infant < 6lbs; Maternal endocrine disorder Type of Endocrine Disorder?: Thyroid  SUBJECTIVE  LC in to visit with [name] of LPTI in the NICU.  Mom has been consistently pumping, during the day.  She is trying to pump 7-8 times per 24 hrs.  Volume is a little higher at between 30-90 ml when she power pumps.  Encouraged using the Medela Symphony in the room.  Mom prefers to pump at home with her hand's free pumps.  No questions at this point and Mom is exclusively pumping and bottle feeding.  OBJECTIVE Infant data: No data recorded Infant feeding assessment Scale for Readiness: 3 Scale for Quality: 3   Maternal data: G1P0101  C-Section, Low Transverse Pumping frequency: Every 3 hrs during the day, sleeps all night Pumped volume: 60 mL (30-90 ml) Flange Size: 24  Pump: Hands Free, Personal (MomCozy)  ASSESSMENT Infant: Feeding Status: Scheduled 9-12-3-6  Maternal: Milk volume: Normal  INTERVENTIONS/PLAN Interventions: Tools: Pump; Flanges  Plan: Consult Status: NICU follow-up NICU Follow-up type: Weekly NICU follow up   Sarah Levy 11/12/2022, 11:52 AM

## 2022-11-15 DIAGNOSIS — Z124 Encounter for screening for malignant neoplasm of cervix: Secondary | ICD-10-CM | POA: Diagnosis not present

## 2022-11-15 DIAGNOSIS — O1413 Severe pre-eclampsia, third trimester: Secondary | ICD-10-CM | POA: Diagnosis not present

## 2022-11-21 ENCOUNTER — Ambulatory Visit (HOSPITAL_COMMUNITY): Payer: Self-pay

## 2022-11-21 NOTE — Lactation Note (Signed)
This note was copied from a baby's chart.  NICU Lactation Consultation Note  Patient Name: Sarah Levy [id number] Date: 11/21/2022 Age:38 wk.o.  Reason for consult: Weekly NICU follow-up; Primapara; 1st time breastfeeding; Early term 37-38.6wks; Infant < 6lbs; Maternal endocrine disorder; Other (Comment); NICU baby; Exclusive pumping and bottle feeding (AMA) Type of Endocrine Disorder?: Thyroid (hypothyroid)  SUBJECTIVE Visited with family of 75 69/63 weeks old AMA NICU female. Sarah Levy is a P1 and reports her pumping frequency has slightly decreased since she went back to work 4 days ago; she's been sleeping in at night. She works from home but still needs to find the time to commit to pumping. She's been using her hands free pump when on the go and her full size pump when at home and also for power pumping. She voiced she's now producing enough to keep baby's "Sarah Levy" in breastmilk but once her intake increases even more, the volumes will be completed with formula. Reviewed pumping schedule, lactogenesis III, strategies to increase supply and anticipatory guidelines.   OBJECTIVE Infant data: Mother's Current Feeding Choice: Breast Milk  Infant feeding assessment Scale for Readiness: 2 Scale for Quality: 2   Maternal data: G1P0101  C-Section, Low Transverse Pumping frequency: 6-7 times/24 hours Pumped volume: 45 mL (45-60 ml; up to 90 ml if power pumping) Flange Size: 24; 21  Pump: Hands Free, Personal (MomCozy)  ASSESSMENT Infant: Feeding Status: Scheduled 9-12-3-6  Maternal: Milk volume: Low  INTERVENTIONS/PLAN Interventions: Interventions: Breast feeding basics reviewed; DEBP; Education Tools: Pump; Flanges; Coconut oil Pump Education: Setup, frequency, and cleaning; Milk Storage  Plan: Encouraged pumping every 3 hours, ideally 7-8 pumping sessions/24 hours She'll try power pumping in the AM, especially if she goes 6 hours without pumping at night   No other  support person at this time. All questions and concerns answered, family to contact [facility] services PRN.  Consult Status: NICU follow-up NICU Follow-up type: Weekly NICU follow up   Sarah Levy 11/21/2022, 4:17 PM

## 2022-12-02 ENCOUNTER — Ambulatory Visit (HOSPITAL_COMMUNITY): Payer: Self-pay

## 2022-12-02 NOTE — Lactation Note (Signed)
This note was copied from a baby's chart.  NICU Lactation Consultation Note  Patient Name: Sarah Levy [id number] Date: 12/02/2022 Age:38 wk.o.  Reason for consult: Weekly NICU follow-up; 1st time breastfeeding; Primapara; NICU baby; Early term 41-38.6wks; Maternal endocrine disorder; Other (Comment); Exclusive pumping and bottle feeding (AMA) Type of Endocrine Disorder?: Thyroid (hypothyroid)  SUBJECTIVE Visited with family of 9 18/9 weeks old AGA NICU female; Sarah Levy is a P1 and reports she continues pumping consistently with both, her full size pump at home and her wearable pump when on the go. Baby is mostly on a breastmilk diet taking 5 bottles of breastmilk and 3 of formula. Family is taking baby "Sarah Levy" home today. Reviewed discharge education and the importance of consistent pumping to protect her supply. She also asked for tips on how to scale down/stop pumping in case she decides doing that later on. Provided education regarding lactation and involution. No other support person at this time. All questions and concerns answered, family to contact [facility] services PRN.  OBJECTIVE Infant data: Mother's Current Feeding Choice: Breast Milk and Formula  Infant feeding assessment Scale for Readiness: 1 Scale for Quality: 2   Maternal data: G1P0101  C-Section, Low Transverse Pumping frequency: 6-7 times/24 hours Pumped volume: 60 mL Flange Size: 21; 24  Pump: Hands Free, Personal (MomCozy)  ASSESSMENT Infant: Feeding Status: Ad lib  Maternal: Milk volume: Low  INTERVENTIONS/PLAN Interventions: Interventions: Breast feeding basics reviewed; DEBP; Education Discharge Education: Outpatient recommendation Tools: Pump; Flanges; Coconut oil Pump Education: Setup, frequency, and cleaning; Milk Storage  Plan: Consult Status: Complete   Sarah Levy 12/02/2022, 10:47 AM

## 2022-12-07 ENCOUNTER — Inpatient Hospital Stay (HOSPITAL_COMMUNITY)
Admission: EM | Admit: 2022-12-07 | Discharge: 2022-12-10 | DRG: 418 | Disposition: A | Discharge status: Home / Self Care | Payer: BC Managed Care – PPO | Attending: Internal Medicine | Admitting: Internal Medicine

## 2022-12-07 ENCOUNTER — Other Ambulatory Visit: Payer: Self-pay

## 2022-12-07 ENCOUNTER — Encounter (HOSPITAL_COMMUNITY): Payer: Self-pay | Admitting: Emergency Medicine

## 2022-12-07 DIAGNOSIS — Z882 Allergy status to sulfonamides status: Secondary | ICD-10-CM

## 2022-12-07 DIAGNOSIS — R809 Proteinuria, unspecified: Secondary | ICD-10-CM | POA: Diagnosis not present

## 2022-12-07 DIAGNOSIS — R7401 Elevation of levels of liver transaminase levels: Secondary | ICD-10-CM | POA: Diagnosis present

## 2022-12-07 DIAGNOSIS — K2980 Duodenitis without bleeding: Secondary | ICD-10-CM | POA: Diagnosis not present

## 2022-12-07 DIAGNOSIS — K5900 Constipation, unspecified: Secondary | ICD-10-CM | POA: Diagnosis not present

## 2022-12-07 DIAGNOSIS — Z87891 Personal history of nicotine dependence: Secondary | ICD-10-CM | POA: Diagnosis not present

## 2022-12-07 DIAGNOSIS — E119 Type 2 diabetes mellitus without complications: Secondary | ICD-10-CM | POA: Diagnosis not present

## 2022-12-07 DIAGNOSIS — Z6837 Body mass index (BMI) 37.0-37.9, adult: Secondary | ICD-10-CM | POA: Diagnosis not present

## 2022-12-07 DIAGNOSIS — Z833 Family history of diabetes mellitus: Secondary | ICD-10-CM | POA: Diagnosis not present

## 2022-12-07 DIAGNOSIS — Z79890 Hormone replacement therapy (postmenopausal): Secondary | ICD-10-CM | POA: Diagnosis not present

## 2022-12-07 DIAGNOSIS — R1111 Vomiting without nausea: Secondary | ICD-10-CM | POA: Diagnosis not present

## 2022-12-07 DIAGNOSIS — E039 Hypothyroidism, unspecified: Secondary | ICD-10-CM | POA: Diagnosis not present

## 2022-12-07 DIAGNOSIS — J45909 Unspecified asthma, uncomplicated: Secondary | ICD-10-CM | POA: Diagnosis not present

## 2022-12-07 DIAGNOSIS — K5730 Diverticulosis of large intestine without perforation or abscess without bleeding: Secondary | ICD-10-CM | POA: Diagnosis not present

## 2022-12-07 DIAGNOSIS — Z79899 Other long term (current) drug therapy: Secondary | ICD-10-CM

## 2022-12-07 DIAGNOSIS — K851 Biliary acute pancreatitis without necrosis or infection: Principal | ICD-10-CM | POA: Diagnosis present

## 2022-12-07 DIAGNOSIS — R001 Bradycardia, unspecified: Secondary | ICD-10-CM | POA: Diagnosis not present

## 2022-12-07 DIAGNOSIS — E669 Obesity, unspecified: Secondary | ICD-10-CM | POA: Diagnosis not present

## 2022-12-07 DIAGNOSIS — Z8249 Family history of ischemic heart disease and other diseases of the circulatory system: Secondary | ICD-10-CM | POA: Diagnosis not present

## 2022-12-07 DIAGNOSIS — R1084 Generalized abdominal pain: Secondary | ICD-10-CM | POA: Diagnosis not present

## 2022-12-07 DIAGNOSIS — R188 Other ascites: Secondary | ICD-10-CM | POA: Diagnosis not present

## 2022-12-07 DIAGNOSIS — Z83438 Family history of other disorder of lipoprotein metabolism and other lipidemia: Secondary | ICD-10-CM

## 2022-12-07 DIAGNOSIS — Z888 Allergy status to other drugs, medicaments and biological substances status: Secondary | ICD-10-CM | POA: Diagnosis not present

## 2022-12-07 DIAGNOSIS — I10 Essential (primary) hypertension: Secondary | ICD-10-CM | POA: Diagnosis present

## 2022-12-07 DIAGNOSIS — K8010 Calculus of gallbladder with chronic cholecystitis without obstruction: Secondary | ICD-10-CM | POA: Diagnosis not present

## 2022-12-07 DIAGNOSIS — E785 Hyperlipidemia, unspecified: Secondary | ICD-10-CM | POA: Diagnosis not present

## 2022-12-07 DIAGNOSIS — K859 Acute pancreatitis without necrosis or infection, unspecified: Secondary | ICD-10-CM | POA: Diagnosis not present

## 2022-12-07 DIAGNOSIS — K8020 Calculus of gallbladder without cholecystitis without obstruction: Secondary | ICD-10-CM | POA: Diagnosis not present

## 2022-12-07 DIAGNOSIS — K219 Gastro-esophageal reflux disease without esophagitis: Secondary | ICD-10-CM | POA: Diagnosis present

## 2022-12-07 DIAGNOSIS — R1013 Epigastric pain: Secondary | ICD-10-CM | POA: Diagnosis not present

## 2022-12-07 HISTORY — DX: Essential (primary) hypertension: I10

## 2022-12-07 HISTORY — DX: Unspecified pre-eclampsia, unspecified trimester: O14.90

## 2022-12-07 LAB — COMPREHENSIVE METABOLIC PANEL
ALT: 478 U/L — ABNORMAL HIGH (ref 0–44)
AST: 376 U/L — ABNORMAL HIGH (ref 15–41)
Albumin: 3.4 g/dL — ABNORMAL LOW (ref 3.5–5.0)
Alkaline Phosphatase: 226 U/L — ABNORMAL HIGH (ref 38–126)
Anion gap: 7 (ref 5–15)
BUN: 6 mg/dL (ref 6–20)
CO2: 26 mmol/L (ref 22–32)
Calcium: 8.4 mg/dL — ABNORMAL LOW (ref 8.9–10.3)
Chloride: 107 mmol/L (ref 98–111)
Creatinine, Ser: 0.92 mg/dL (ref 0.44–1.00)
GFR, Estimated: >60 mL/min (ref >60)
Glucose, Bld: 135 mg/dL — ABNORMAL HIGH (ref 70–99)
Potassium: 3 mmol/L — ABNORMAL LOW (ref 3.5–5.1)
Sodium: 140 mmol/L (ref 135–145)
Total Bilirubin: 2.1 mg/dL — ABNORMAL HIGH (ref 0.3–1.2)
Total Protein: 6 g/dL — ABNORMAL LOW (ref 6.5–8.1)

## 2022-12-07 LAB — URINALYSIS, ROUTINE W REFLEX MICROSCOPIC
Bilirubin Urine: NEGATIVE
Glucose, UA: NEGATIVE mg/dL
Hgb urine dipstick: NEGATIVE
Ketones, ur: NEGATIVE mg/dL
Leukocytes,Ua: NEGATIVE
Nitrite: NEGATIVE
Protein, ur: 30 mg/dL — AB
Specific Gravity, Urine: 1.012 (ref 1.005–1.030)
pH: 5 (ref 5.0–8.0)

## 2022-12-07 LAB — CBC WITH DIFFERENTIAL/PLATELET
Abs Immature Granulocytes: 0.04 10*3/uL (ref 0.00–0.07)
Basophils Absolute: 0 10*3/uL (ref 0.0–0.1)
Basophils Relative: 0 %
Eosinophils Absolute: 0.2 10*3/uL (ref 0.0–0.5)
Eosinophils Relative: 2 %
HCT: 37.9 % (ref 36.0–46.0)
Hemoglobin: 12 g/dL (ref 12.0–15.0)
Immature Granulocytes: 0 %
Lymphocytes Relative: 11 %
Lymphs Abs: 1 10*3/uL (ref 0.7–4.0)
MCH: 26.5 pg (ref 26.0–34.0)
MCHC: 31.7 g/dL (ref 30.0–36.0)
MCV: 83.7 fL (ref 80.0–100.0)
Monocytes Absolute: 0.5 10*3/uL (ref 0.1–1.0)
Monocytes Relative: 6 %
Neutro Abs: 7.2 10*3/uL (ref 1.7–7.7)
Neutrophils Relative %: 81 %
Platelets: 426 10*3/uL — ABNORMAL HIGH (ref 150–400)
RBC: 4.53 MIL/uL (ref 3.87–5.11)
RDW: 13.3 % (ref 11.5–15.5)
WBC: 9 10*3/uL (ref 4.0–10.5)
nRBC: 0 % (ref 0.0–0.2)

## 2022-12-07 LAB — PREGNANCY, URINE: Preg Test, Ur: NEGATIVE

## 2022-12-07 LAB — LIPASE, BLOOD: Lipase: 4825 U/L — ABNORMAL HIGH (ref 11–51)

## 2022-12-07 NOTE — ED Provider Triage Note (Signed)
Emergency Medicine Provider Triage Evaluation Note  Sarah Levy , a 37 y.o. female  was evaluated in triage.  Pt complains of epigastric pain that began 3 weeks ago.  Patient states she does not have history of pancreatitis but does have history of hypertension.  Patient endorsed 4 episodes of nausea and emesis today however nonbloody.  Patient has full sensation in all 4 limbs and denies any chest pain/shortness of breath.  Patient denied fevers, dysuria, diarrhea  Triage note states that patient was bradycardic upon arrival however patient's heart rate was 69 patient did not endorse any concerns of this to me  Review of Systems  Positive: See HPI Negative: See HPI  Physical Exam  BP (!) 142/90 (BP Location: Right Arm)   Pulse 69   Temp 97.8 F (36.6 C) (Oral)   Resp 16   Ht 5\' 4"  (1.626 m)   Wt 100 kg   SpO2 95%   BMI 37.84 kg/m  Gen:   Awake, no distress   Resp:  Normal effort  MSK:   Moves extremities without difficulty  Other:  Nontender to abdomen, no peritoneal signs noted  Medical Decision Making  Medically screening exam initiated at 7:52 PM.  Appropriate orders placed.  Sarah Levy was informed that the remainder of the evaluation will be completed by another provider, this initial triage assessment does not replace that evaluation, and the importance of remaining in the ED until their evaluation is complete.  Workup initiated, patient received Zofran from EMS and is now currently nauseous   Sarah Levy 12/07/22 1953

## 2022-12-07 NOTE — ED Triage Notes (Signed)
Pt BIB EMS from home with c/o intermittent x 3 weeks epigastric abdominal pain that radiates through to her back with N/V. Pt was placed on labetolol during pregnancy for HTN, pt took a dose at 1700 tonight. Pt is 2 months postpartum.  HR 50s with ems, unknown per pt if her heart rate was low during pregnancy as well.   4mg  zofran with ems  [facility]

## 2022-12-08 ENCOUNTER — Emergency Department (HOSPITAL_COMMUNITY): Payer: BC Managed Care – PPO

## 2022-12-08 ENCOUNTER — Encounter (HOSPITAL_COMMUNITY): Payer: Self-pay | Admitting: Internal Medicine

## 2022-12-08 DIAGNOSIS — J45909 Unspecified asthma, uncomplicated: Secondary | ICD-10-CM | POA: Diagnosis present

## 2022-12-08 DIAGNOSIS — Z79890 Hormone replacement therapy (postmenopausal): Secondary | ICD-10-CM | POA: Diagnosis not present

## 2022-12-08 DIAGNOSIS — K8010 Calculus of gallbladder with chronic cholecystitis without obstruction: Secondary | ICD-10-CM | POA: Diagnosis present

## 2022-12-08 DIAGNOSIS — K851 Biliary acute pancreatitis without necrosis or infection: Principal | ICD-10-CM | POA: Diagnosis present

## 2022-12-08 DIAGNOSIS — K219 Gastro-esophageal reflux disease without esophagitis: Secondary | ICD-10-CM | POA: Diagnosis present

## 2022-12-08 DIAGNOSIS — I10 Essential (primary) hypertension: Secondary | ICD-10-CM | POA: Diagnosis present

## 2022-12-08 DIAGNOSIS — Z6837 Body mass index (BMI) 37.0-37.9, adult: Secondary | ICD-10-CM | POA: Diagnosis not present

## 2022-12-08 DIAGNOSIS — R7401 Elevation of levels of liver transaminase levels: Secondary | ICD-10-CM | POA: Diagnosis present

## 2022-12-08 DIAGNOSIS — Z87891 Personal history of nicotine dependence: Secondary | ICD-10-CM | POA: Diagnosis not present

## 2022-12-08 DIAGNOSIS — E039 Hypothyroidism, unspecified: Secondary | ICD-10-CM | POA: Diagnosis present

## 2022-12-08 DIAGNOSIS — Z79899 Other long term (current) drug therapy: Secondary | ICD-10-CM | POA: Diagnosis not present

## 2022-12-08 DIAGNOSIS — Z8249 Family history of ischemic heart disease and other diseases of the circulatory system: Secondary | ICD-10-CM | POA: Diagnosis not present

## 2022-12-08 DIAGNOSIS — E785 Hyperlipidemia, unspecified: Secondary | ICD-10-CM | POA: Diagnosis not present

## 2022-12-08 DIAGNOSIS — Z833 Family history of diabetes mellitus: Secondary | ICD-10-CM | POA: Diagnosis not present

## 2022-12-08 DIAGNOSIS — Z888 Allergy status to other drugs, medicaments and biological substances status: Secondary | ICD-10-CM | POA: Diagnosis not present

## 2022-12-08 DIAGNOSIS — K5900 Constipation, unspecified: Secondary | ICD-10-CM | POA: Diagnosis present

## 2022-12-08 DIAGNOSIS — E669 Obesity, unspecified: Secondary | ICD-10-CM | POA: Diagnosis present

## 2022-12-08 DIAGNOSIS — R809 Proteinuria, unspecified: Secondary | ICD-10-CM | POA: Diagnosis present

## 2022-12-08 DIAGNOSIS — Z83438 Family history of other disorder of lipoprotein metabolism and other lipidemia: Secondary | ICD-10-CM | POA: Diagnosis not present

## 2022-12-08 DIAGNOSIS — Z882 Allergy status to sulfonamides status: Secondary | ICD-10-CM | POA: Diagnosis not present

## 2022-12-08 LAB — COMPREHENSIVE METABOLIC PANEL
ALT: 524 U/L — ABNORMAL HIGH (ref 0–44)
AST: 306 U/L — ABNORMAL HIGH (ref 15–41)
Albumin: 3.5 g/dL (ref 3.5–5.0)
Alkaline Phosphatase: 240 U/L — ABNORMAL HIGH (ref 38–126)
Anion gap: 14 (ref 5–15)
BUN: 7 mg/dL (ref 6–20)
CO2: 25 mmol/L (ref 22–32)
Calcium: 8.9 mg/dL (ref 8.9–10.3)
Chloride: 101 mmol/L (ref 98–111)
Creatinine, Ser: 0.97 mg/dL (ref 0.44–1.00)
GFR, Estimated: >60 mL/min (ref >60)
Glucose, Bld: 114 mg/dL — ABNORMAL HIGH (ref 70–99)
Potassium: 3.7 mmol/L (ref 3.5–5.1)
Sodium: 140 mmol/L (ref 135–145)
Total Bilirubin: 1.2 mg/dL (ref 0.3–1.2)
Total Protein: 6.3 g/dL — ABNORMAL LOW (ref 6.5–8.1)

## 2022-12-08 LAB — TRIGLYCERIDES: Triglycerides: 112 mg/dL (ref <150)

## 2022-12-08 LAB — HIV ANTIBODY (ROUTINE TESTING W REFLEX): HIV Screen 4th Generation wRfx: NONREACTIVE

## 2022-12-08 MED ORDER — IOHEXOL 350 MG/ML SOLN
75.0000 mL | Freq: Once | INTRAVENOUS | Status: AC | PRN
  Administered 2022-12-08: 75 mL via INTRAVENOUS

## 2022-12-08 MED ORDER — POTASSIUM CHLORIDE CRYS ER 20 MEQ PO TBCR
40.0000 meq | EXTENDED_RELEASE_TABLET | Freq: Two times a day (BID) | ORAL | Status: AC
  Administered 2022-12-08 (×2): 40 meq via ORAL
  Filled 2022-12-08 (×2): qty 2

## 2022-12-08 MED ORDER — SODIUM CHLORIDE 0.9 % IV BOLUS (SEPSIS)
1000.0000 mL | Freq: Once | INTRAVENOUS | Status: AC
  Administered 2022-12-08: 1000 mL via INTRAVENOUS

## 2022-12-08 MED ORDER — POLYETHYLENE GLYCOL 3350 17 G PO PACK
17.0000 g | PACK | Freq: Every day | ORAL | Status: DC
  Administered 2022-12-08 – 2022-12-10 (×2): 17 g via ORAL
  Filled 2022-12-08 (×2): qty 1

## 2022-12-08 MED ORDER — ENOXAPARIN SODIUM 60 MG/0.6ML IJ SOSY
50.0000 mg | PREFILLED_SYRINGE | INTRAMUSCULAR | Status: DC
  Filled 2022-12-08: qty 0.6

## 2022-12-08 MED ORDER — OXYCODONE HCL 5 MG PO TABS
5.0000 mg | ORAL_TABLET | ORAL | Status: DC | PRN
  Administered 2022-12-08 – 2022-12-10 (×6): 5 mg via ORAL
  Filled 2022-12-08 (×7): qty 1

## 2022-12-08 MED ORDER — ONDANSETRON HCL 4 MG/2ML IJ SOLN
4.0000 mg | Freq: Once | INTRAMUSCULAR | Status: AC
  Administered 2022-12-08: 4 mg via INTRAVENOUS
  Filled 2022-12-08: qty 2

## 2022-12-08 MED ORDER — SODIUM CHLORIDE 0.9 % IV SOLN
1000.0000 mL | INTRAVENOUS | Status: DC
  Administered 2022-12-08: 1000 mL via INTRAVENOUS

## 2022-12-08 MED ORDER — ENOXAPARIN SODIUM 40 MG/0.4ML IJ SOSY: 40.0000 mg | PREFILLED_SYRINGE | INTRAMUSCULAR | Status: DC

## 2022-12-08 MED ORDER — HYDROMORPHONE HCL 1 MG/ML IJ SOLN
0.5000 mg | INTRAMUSCULAR | Status: DC | PRN
  Administered 2022-12-08 – 2022-12-10 (×8): 1 mg via INTRAVENOUS
  Filled 2022-12-08 (×9): qty 1

## 2022-12-08 MED ORDER — ONDANSETRON HCL 4 MG/2ML IJ SOLN: 4.0000 mg | Freq: Four times a day (QID) | INTRAMUSCULAR | Status: DC | PRN

## 2022-12-08 MED ORDER — SENNOSIDES-DOCUSATE SODIUM 8.6-50 MG PO TABS
1.0000 | ORAL_TABLET | Freq: Every day | ORAL | Status: DC
  Administered 2022-12-08 – 2022-12-09 (×3): 1 via ORAL
  Filled 2022-12-08 (×3): qty 1

## 2022-12-08 MED ORDER — LEVOTHYROXINE SODIUM 88 MCG PO TABS
88.0000 ug | ORAL_TABLET | Freq: Every day | ORAL | Status: DC
  Administered 2022-12-08 – 2022-12-10 (×3): 88 ug via ORAL
  Filled 2022-12-08 (×4): qty 1

## 2022-12-08 MED ORDER — SODIUM CHLORIDE 0.9 % IV SOLN
1000.0000 mL | INTRAVENOUS | Status: DC
  Administered 2022-12-08 – 2022-12-09 (×3): 1000 mL via INTRAVENOUS

## 2022-12-08 MED ORDER — PANTOPRAZOLE SODIUM 40 MG PO TBEC
40.0000 mg | DELAYED_RELEASE_TABLET | Freq: Every day | ORAL | Status: DC
  Administered 2022-12-08 – 2022-12-10 (×3): 40 mg via ORAL
  Filled 2022-12-08 (×3): qty 1

## 2022-12-08 MED ORDER — HYDROMORPHONE HCL 1 MG/ML IJ SOLN
0.5000 mg | INTRAMUSCULAR | Status: DC | PRN
  Administered 2022-12-08: 0.5 mg via INTRAVENOUS
  Filled 2022-12-08: qty 1

## 2022-12-08 NOTE — Plan of Care (Signed)

## 2022-12-08 NOTE — ED Notes (Signed)
ED TO INPATIENT HANDOFF REPORT  ED Nurse Name and Phone #: Kathryne Hitch [phone]  S Name/Age/Gender Sarah Levy 37 y.o. female Room/Bed: 044C/044C  Code Status   Code Status: Full Code  Home/SNF/Other Home Patient oriented to: self, place, time, and situation Is this baseline? Yes   Triage Complete: Triage complete  Chief Complaint Acute gallstone pancreatitis [K85.10]  Triage Note Pt BIB EMS from home with c/o intermittent x 3 weeks epigastric abdominal pain that radiates through to her back with N/V. Pt was placed on labetolol during pregnancy for HTN, pt took a dose at 1700 tonight. Pt is 2 months postpartum.  HR 50s with ems, unknown per pt if her heart rate was low during pregnancy as well.   4mg  zofran with ems  20RAC   Allergies Allergies  Allergen Reactions   Sulfa Antibiotics     Feels like throat is closing   Tylenol [Acetaminophen]     Itchy throat, feels like it is closing    Level of Care/Admitting Diagnosis ED Disposition     ED Disposition  Admit   Condition  --   Comment  Hospital Area: [facility] [[id number]]  Level of Care: Med-Surg [16]  May place patient in observation at [facility] or [facility] if equivalent level of care is available:: No  Covid Evaluation: Asymptomatic - no recent exposure (last 10 days) testing not required  Diagnosis: Acute gallstone pancreatitis [[id number]]  Admitting Physician: Mercie Eon [[id number]]  Attending Physician: Mercie Eon [[id number]]          B Medical/Surgery History Past Medical History:  Diagnosis Date   Acid reflux    Allergies    Asthma    sports or allergies induced   Back pain    Hypertension    Hypothyroidism    Joint pain    Pre-eclampsia    SOB (shortness of breath)    Past Surgical History:  Procedure Laterality Date   CESAREAN SECTION N/A 10/04/2022   Procedure: CESAREAN SECTION;  Surgeon: Olivia Mackie, MD;  Location: [facility] LD ORS;  Service: Obstetrics;   Laterality: N/A;   WISDOM TOOTH EXTRACTION       A IV Location/Drains/Wounds Patient Lines/Drains/Airways Status     Active Line/Drains/Airways     Name Placement date Placement time Site Days   Peripheral IV 12/07/22 20 G Right Antecubital 12/07/22  1949  Antecubital  1            Intake/Output Last 24 hours  Intake/Output Summary (Last 24 hours) at 12/08/2022 0446 Last data filed at 12/08/2022 0057 Gross per 24 hour  Intake 1000 ml  Output --  Net 1000 ml    Labs/Imaging Results for orders placed or performed during the hospital encounter of 12/07/22 (from the past 48 hour(s))  Urinalysis, Routine w reflex microscopic -Urine, Clean Catch     Status: Abnormal   Collection Time: 12/07/22  7:52 PM  Result Value Ref Range   Color, Urine AMBER (A) YELLOW    Comment: BIOCHEMICALS MAY BE AFFECTED BY COLOR   APPearance HAZY (A) CLEAR   Specific Gravity, Urine 1.012 1.005 - 1.030   pH 5.0 5.0 - 8.0   Glucose, UA NEGATIVE NEGATIVE mg/dL   Hgb urine dipstick NEGATIVE NEGATIVE   Bilirubin Urine NEGATIVE NEGATIVE   Ketones, ur NEGATIVE NEGATIVE mg/dL   Protein, ur 30 (A) NEGATIVE mg/dL   Nitrite NEGATIVE NEGATIVE   Leukocytes,Ua NEGATIVE NEGATIVE   RBC / HPF 0-5 0 - 5  RBC/hpf   WBC, UA 0-5 0 - 5 WBC/hpf   Bacteria, UA RARE (A) NONE SEEN   Squamous Epithelial / HPF 0-5 0 - 5 /HPF   Mucus PRESENT     Comment: Performed at [facility], [address] [place], [place] [address]  Pregnancy, urine     Status: None   Collection Time: 12/07/22  7:52 PM  Result Value Ref Range   Preg Test, Ur NEGATIVE NEGATIVE    Comment:        THE SENSITIVITY OF THIS METHODOLOGY IS >25 mIU/mL. Performed at [facility], [address] [place], [place] [address]   CBC with Differential     Status: Abnormal   Collection Time: 12/07/22  7:53 PM  Result Value Ref Range   WBC 9.0 4.0 - 10.5 K/uL   RBC 4.53 3.87 - 5.11 MIL/uL   Hemoglobin 12.0 12.0 - 15.0 g/dL   HCT 82.9 56.2 -  13.0 %   MCV 83.7 80.0 - 100.0 fL   MCH 26.5 26.0 - 34.0 pg   MCHC 31.7 30.0 - 36.0 g/dL   RDW 86.5 78.4 - 69.6 %   Platelets 426 (H) 150 - 400 K/uL   nRBC 0.0 0.0 - 0.2 %   Neutrophils Relative % 81 %   Neutro Abs 7.2 1.7 - 7.7 K/uL   Lymphocytes Relative 11 %   Lymphs Abs 1.0 0.7 - 4.0 K/uL   Monocytes Relative 6 %   Monocytes Absolute 0.5 0.1 - 1.0 K/uL   Eosinophils Relative 2 %   Eosinophils Absolute 0.2 0.0 - 0.5 K/uL   Basophils Relative 0 %   Basophils Absolute 0.0 0.0 - 0.1 K/uL   Immature Granulocytes 0 %   Abs Immature Granulocytes 0.04 0.00 - 0.07 K/uL    Comment: Performed at [facility], [address] [place], [place] [address]  Comprehensive metabolic panel     Status: Abnormal   Collection Time: 12/07/22  7:53 PM  Result Value Ref Range   Sodium 140 135 - 145 mmol/L   Potassium 3.0 (L) 3.5 - 5.1 mmol/L   Chloride 107 98 - 111 mmol/L   CO2 26 22 - 32 mmol/L   Glucose, Bld 135 (H) 70 - 99 mg/dL    Comment: Glucose reference range applies only to samples taken after fasting for at least 8 hours.   BUN 6 6 - 20 mg/dL   Creatinine, Ser 4.13 0.44 - 1.00 mg/dL   Calcium 8.4 (L) 8.9 - 10.3 mg/dL   Total Protein 6.0 (L) 6.5 - 8.1 g/dL   Albumin 3.4 (L) 3.5 - 5.0 g/dL   AST 244 (H) 15 - 41 U/L   ALT 478 (H) 0 - 44 U/L   Alkaline Phosphatase 226 (H) 38 - 126 U/L   Total Bilirubin 2.1 (H) 0.3 - 1.2 mg/dL   GFR, Estimated >01 >02 mL/min    Comment: (NOTE) Calculated using the CKD-EPI Creatinine Equation (2021)    Anion gap 7 5 - 15    Comment: Performed at [facility], [address] [place], [place] [address]  Lipase, blood     Status: Abnormal   Collection Time: 12/07/22  7:53 PM  Result Value Ref Range   Lipase 4,825 (H) 11 - 51 U/L    Comment: RESULT CONFIRMED BY MANUAL DILUTION Performed at [facility], [address] [place], [place] [address]   Triglycerides  Status: None   Collection Time: 12/07/22  7:53 PM  Result Value Ref  Range   Triglycerides 112 <150 mg/dL    Comment: Performed at [facility], [address], [place], [place] [address]  Comprehensive metabolic panel     Status: Abnormal   Collection Time: 12/08/22  3:45 AM  Result Value Ref Range   Sodium 140 135 - 145 mmol/L   Potassium 3.7 3.5 - 5.1 mmol/L   Chloride 101 98 - 111 mmol/L   CO2 25 22 - 32 mmol/L   Glucose, Bld 114 (H) 70 - 99 mg/dL    Comment: Glucose reference range applies only to samples taken after fasting for at least 8 hours.   BUN 7 6 - 20 mg/dL   Creatinine, Ser 4.40 0.44 - 1.00 mg/dL   Calcium 8.9 8.9 - 34.7 mg/dL   Total Protein 6.3 (L) 6.5 - 8.1 g/dL   Albumin 3.5 3.5 - 5.0 g/dL   AST 425 (H) 15 - 41 U/L   ALT 524 (H) 0 - 44 U/L   Alkaline Phosphatase 240 (H) 38 - 126 U/L   Total Bilirubin 1.2 0.3 - 1.2 mg/dL   GFR, Estimated >95 >63 mL/min    Comment: (NOTE) Calculated using the CKD-EPI Creatinine Equation (2021)    Anion gap 14 5 - 15    Comment: Performed at [facility], [address], [place], [place] [address]   CT ABDOMEN PELVIS W CONTRAST  Result Date: 12/08/2022 CLINICAL DATA:  Intermittent epigastric pain. EXAM: CT ABDOMEN AND PELVIS WITH CONTRAST TECHNIQUE: Multidetector CT imaging of the abdomen and pelvis was performed using the standard protocol following bolus administration of intravenous contrast. RADIATION DOSE REDUCTION: This exam was performed according to the departmental dose-optimization program which includes automated exposure control, adjustment of the mA and/or kV according to patient size and/or use of iterative reconstruction technique. CONTRAST:  75mL OMNIPAQUE IOHEXOL 350 MG/ML SOLN COMPARISON:  None Available. FINDINGS: Lower chest: No acute abnormality. Hepatobiliary: There is diffuse fatty infiltration of the liver parenchyma. No focal liver abnormality is seen. No gallstones, gallbladder wall thickening, or biliary dilatation. Pancreas: There is marked severity peripancreatic  inflammatory fat stranding and peripancreatic fluid. No pancreatic ductal dilatation is identified. Spleen: Normal in size without focal abnormality. Adrenals/Urinary Tract: Adrenal glands are unremarkable. Kidneys are normal, without renal calculi, focal lesion, or hydronephrosis. Bladder is unremarkable. Stomach/Bowel: Stomach is within normal limits. Appendix appears normal. No evidence of bowel dilatation. Peri duodenal inflammatory fat stranding is seen which appears to originate from the pancreas. Noninflamed diverticula are seen throughout the large bowel. Vascular/Lymphatic: No significant vascular findings are present. No enlarged abdominal or pelvic lymph nodes. Reproductive: Uterus and bilateral adnexa are unremarkable. Other: No abdominal wall hernia or abnormality. No abdominopelvic ascites. Musculoskeletal: No acute or significant osseous findings. IMPRESSION: 1. Marked severity acute pancreatitis. 2. Duodenitis involving the adjacent portion of the proximal duodenum cannot be excluded. 3. Colonic diverticulosis. Electronically Signed   By: Aram Candela M.D.   On: 12/08/2022 01:39   US Abdomen Limited RUQ (LIVER/GB)  Result Date: 12/08/2022 CLINICAL DATA:  Epigastric pain. EXAM: ULTRASOUND ABDOMEN LIMITED RIGHT UPPER QUADRANT COMPARISON:  None Available. FINDINGS: Gallbladder: Multiple small, shadowing echogenic gallstones are seen within the gallbladder fundus. The largest measures approximately 6 mm. The gallbladder wall measures 3.7 mm in thickness. No sonographic Murphy sign noted by sonographer. Common bile duct: Diameter: 2.2 mm Liver: No focal lesion identified. Within normal limits in  parenchymal echogenicity. Portal vein is patent on color Doppler imaging with normal direction of blood flow towards the liver. Other: None. IMPRESSION: Cholelithiasis, without evidence of acute cholecystitis. Electronically Signed   By: Aram Candela M.D.   On: 12/08/2022 00:57    Pending  Labs Unresulted Labs (From admission, onward)     Start     Ordered   12/08/22 0305  HIV Antibody (routine testing w rflx)  (HIV Antibody (Routine testing w reflex) panel)  Once,   R        12/08/22 0311            Vitals/Pain Today's Vitals   12/08/22 0315 12/08/22 0345 12/08/22 0400 12/08/22 0401  BP: (!) 148/82 (!) 161/96 (!) 160/83   Pulse: (!) 57 77 66   Resp:   17   Temp:      TempSrc:      SpO2: 98% 100% 99%   Weight:      Height:      PainSc:    5     Isolation Precautions No active isolations  Medications Medications  sodium chloride 0.9 % bolus 1,000 mL (0 mLs Intravenous Stopped 12/08/22 0057)    Followed by  0.9 %  sodium chloride infusion (1,000 mLs Intravenous New Bag/Given 12/08/22 0141)  levothyroxine (SYNTHROID) tablet 88 mcg (has no administration in time range)  pantoprazole (PROTONIX) EC tablet 40 mg (has no administration in time range)  enoxaparin (LOVENOX) injection 40 mg (has no administration in time range)  oxyCODONE (Oxy IR/ROXICODONE) immediate release tablet 5 mg (5 mg Oral Given 12/08/22 0341)  HYDROmorphone (DILAUDID) injection 0.5-1 mg (has no administration in time range)  senna-docusate (Senokot-S) tablet 1 tablet (1 tablet Oral Given 12/08/22 0341)  polyethylene glycol (MIRALAX / GLYCOLAX) packet 17 g (has no administration in time range)  potassium chloride SA (KLOR-CON M) CR tablet 40 mEq (40 mEq Oral Given 12/08/22 0341)  ondansetron (ZOFRAN) injection 4 mg (has no administration in time range)  ondansetron (ZOFRAN) injection 4 mg (4 mg Intravenous Given 12/08/22 0026)  iohexol (OMNIPAQUE) 350 MG/ML injection 75 mL (75 mLs Intravenous Contrast Given 12/08/22 0123)    Mobility walks     Focused Assessments Abdominal Pain   R Recommendations: See Admitting Provider Note  Report given to:   Additional Notes: Patient is also taking labetalol for pre-eclampsia.

## 2022-12-08 NOTE — ED Provider Notes (Signed)
[place] EMERGENCY DEPARTMENT AT [facility] Provider Note  CSN: [id number] Arrival date & time: 12/07/22 1942  Chief Complaint(s) Abdominal Pain  HPI Sarah Levy is a 37 y.o. female with a past medical history listed below who presents to the emergency department with several weeks of intermittent epigastric stabbing pain radiating to her back.  Worse with eating.  Associated with nausea and nonbloody/nonbilious emesis.  Pain was worse this evening, prompting her visit.  No known fevers or chills.  No coughing or congestion.  No urinary symptoms.  No chest pain or shortness of breath.  No other physical complaints.  The history is provided by the patient.    Past Medical History Past Medical History:  Diagnosis Date   Acid reflux    Allergies    Asthma    sports or allergies induced   Back pain    Hypertension    Hypothyroidism    Joint pain    Pre-eclampsia    SOB (shortness of breath)    Patient Active Problem List   Diagnosis Date Noted   Preeclampsia 10/06/2022   Rh negative, maternal / newborn Rh negative 10/06/2022   Status post primary low transverse cesarean section 10/04/2022   Postpartum care following cesarean delivery 4/22 10/04/2022   Hypokalemia 09/20/2022   Low HDL (under 40) 12/24/2020   Insulin resistance 12/24/2020   Elevated cholesterol 12/11/2020   Vitamin D deficiency 12/11/2020   Class 2 obesity due to excess calories without serious comorbidity with body mass index (BMI) of 35.0 to 35.9 in adult 12/11/2020   Asthma without status asthmaticus 11/13/2019   GERD (gastroesophageal reflux disease) 11/13/2019   Home Medication(s) Prior to Admission medications   Medication Sig Start Date End Date Taking? Authorizing Provider  albuterol (VENTOLIN HFA) 108 (90 Base) MCG/ACT inhaler Inhale 1-2 puffs into the lungs every 6 (six) hours as needed for wheezing or shortness of breath.    [provider]  busPIRone (BUSPAR) 10 MG  tablet Take 1 tablet (10 mg total) by mouth 2 (two) times daily as needed (anxiety). 10/08/22   Shea Evans, MD  cetirizine (ZYRTEC) 5 MG tablet Take 10 mg by mouth daily.    [provider]  cholecalciferol (VITAMIN D3) 25 MCG (1000 UNIT) tablet Take 1,000 Units by mouth daily.    [provider]  ibuprofen (ADVIL) 600 MG tablet Take 1 tablet (600 mg total) by mouth every 6 (six) hours. 10/08/22   Shea Evans, MD  labetalol (NORMODYNE) 200 MG tablet Take 4 tablets (800 mg total) by mouth every 8 (eight) hours. 10/08/22   Shea Evans, MD  levothyroxine (SYNTHROID) 88 MCG tablet Take 88 mcg by mouth daily before breakfast.    [provider]  Magnesium 400 MG TABS Take 400 mg by mouth daily.    [provider]  NIFEdipine (ADALAT CC) 60 MG 24 hr tablet Take 1 tablet (60 mg total) by mouth 2 (two) times daily. 10/08/22   Shea Evans, MD  oxyCODONE (OXY IR/ROXICODONE) 5 MG immediate release tablet Take 1 tablet (5 mg total) by mouth every 4 (four) hours as needed for moderate pain. 10/08/22   Shea Evans, MD  pantoprazole (PROTONIX) 40 MG tablet Take 40 mg by mouth daily.    [provider]  potassium chloride (KLOR-CON) 10 MEQ tablet Take 40 mEq by mouth daily.    [provider]  Prenatal Vit-Fe Fumarate-FA (MULTIVITAMIN-PRENATAL) 27-0.8 MG TABS tablet Take 1 tablet by mouth daily at  12 noon.    [provider]  Prenatal Vit-Fe Fumarate-FA (PRENATAL MULTIVITAMIN) TABS tablet Take 1 tablet by mouth daily at 12 noon. 10/09/22   Shea Evans, MD  sertraline (ZOLOFT) 25 MG tablet Take 1 tablet (25 mg total) by mouth daily. 10/09/22   Shea Evans, MD                                                                                                                                    Allergies Sulfa antibiotics and Tylenol [acetaminophen]  Review of Systems Review of Systems As noted in HPI  Physical Exam Vital Signs  I have  reviewed the triage vital signs BP (!) 143/96   Pulse 68   Temp 97.8 F (36.6 C)   Resp 16   Ht 5\' 4"  (1.626 m)   Wt 100 kg   SpO2 98%   BMI 37.84 kg/m   Physical Exam Vitals reviewed.  Constitutional:      General: She is not in acute distress.    Appearance: She is well-developed and overweight. She is not diaphoretic.  HENT:     Head: Normocephalic and atraumatic.     Right Ear: External ear normal.     Left Ear: External ear normal.     Nose: Nose normal.  Eyes:     General: No scleral icterus.    Conjunctiva/sclera: Conjunctivae normal.  Neck:     Trachea: Phonation normal.  Cardiovascular:     Rate and Rhythm: Normal rate and regular rhythm.  Pulmonary:     Effort: Pulmonary effort is normal. No respiratory distress.     Breath sounds: No stridor.  Abdominal:     General: There is no distension.     Tenderness: There is abdominal tenderness in the epigastric area.  Musculoskeletal:        General: Normal range of motion.     Cervical back: Normal range of motion.  Neurological:     Mental Status: She is alert and oriented to person, place, and time.  Psychiatric:        Behavior: Behavior normal.     ED Results and Treatments Labs (all labs ordered are listed, but only abnormal results are displayed) Labs Reviewed  CBC WITH DIFFERENTIAL/PLATELET - Abnormal; Notable for the following components:      Result Value   Platelets 426 (*)    All other components within normal limits  COMPREHENSIVE METABOLIC PANEL - Abnormal; Notable for the following components:   Potassium 3.0 (*)    Glucose, Bld 135 (*)    Calcium 8.4 (*)    Total Protein 6.0 (*)    Albumin 3.4 (*)    AST 376 (*)    ALT 478 (*)    Alkaline Phosphatase 226 (*)    Total Bilirubin 2.1 (*)    All other components within normal limits  LIPASE, BLOOD - Abnormal; Notable for the following  components:   Lipase 4,825 (*)    All other components within normal limits  URINALYSIS, ROUTINE W  REFLEX MICROSCOPIC - Abnormal; Notable for the following components:   Color, Urine AMBER (*)    APPearance HAZY (*)    Protein, ur 30 (*)    Bacteria, UA RARE (*)    All other components within normal limits  PREGNANCY, URINE  TRIGLYCERIDES                                                                                                                         EKG  EKG Interpretation  Date/Time:  Tuesday December 07 2022 19:48:31 EDT Ventricular Rate:  67 PR Interval:  176 QRS Duration: 82 QT Interval:  462 QTC Calculation: 488 R Axis:   83 Text Interpretation: Normal sinus rhythm Cannot rule out Anterior infarct , age undetermined Abnormal ECG No previous ECGs available Confirmed by Drema Pry [phone]) on 12/08/2022 1:05:41 AM       Radiology CT ABDOMEN PELVIS W CONTRAST  Result Date: 12/08/2022 CLINICAL DATA:  Intermittent epigastric pain. EXAM: CT ABDOMEN AND PELVIS WITH CONTRAST TECHNIQUE: Multidetector CT imaging of the abdomen and pelvis was performed using the standard protocol following bolus administration of intravenous contrast. RADIATION DOSE REDUCTION: This exam was performed according to the departmental dose-optimization program which includes automated exposure control, adjustment of the mA and/or kV according to patient size and/or use of iterative reconstruction technique. CONTRAST:  75mL OMNIPAQUE IOHEXOL 350 MG/ML SOLN COMPARISON:  None Available. FINDINGS: Lower chest: No acute abnormality. Hepatobiliary: There is diffuse fatty infiltration of the liver parenchyma. No focal liver abnormality is seen. No gallstones, gallbladder wall thickening, or biliary dilatation. Pancreas: There is marked severity peripancreatic inflammatory fat stranding and peripancreatic fluid. No pancreatic ductal dilatation is identified. Spleen: Normal in size without focal abnormality. Adrenals/Urinary Tract: Adrenal glands are unremarkable. Kidneys are normal, without renal calculi, focal  lesion, or hydronephrosis. Bladder is unremarkable. Stomach/Bowel: Stomach is within normal limits. Appendix appears normal. No evidence of bowel dilatation. Peri duodenal inflammatory fat stranding is seen which appears to originate from the pancreas. Noninflamed diverticula are seen throughout the large bowel. Vascular/Lymphatic: No significant vascular findings are present. No enlarged abdominal or pelvic lymph nodes. Reproductive: Uterus and bilateral adnexa are unremarkable. Other: No abdominal wall hernia or abnormality. No abdominopelvic ascites. Musculoskeletal: No acute or significant osseous findings. IMPRESSION: 1. Marked severity acute pancreatitis. 2. Duodenitis involving the adjacent portion of the proximal duodenum cannot be excluded. 3. Colonic diverticulosis. Electronically Signed   By: Aram Candela M.D.   On: 12/08/2022 01:39   US Abdomen Limited RUQ (LIVER/GB)  Result Date: 12/08/2022 CLINICAL DATA:  Epigastric pain. EXAM: ULTRASOUND ABDOMEN LIMITED RIGHT UPPER QUADRANT COMPARISON:  None Available. FINDINGS: Gallbladder: Multiple small, shadowing echogenic gallstones are seen within the gallbladder fundus. The largest measures approximately 6 mm. The gallbladder wall measures 3.7 mm in thickness. No sonographic Murphy sign noted by sonographer. Common bile duct: Diameter: 2.2  mm Liver: No focal lesion identified. Within normal limits in parenchymal echogenicity. Portal vein is patent on color Doppler imaging with normal direction of blood flow towards the liver. Other: None. IMPRESSION: Cholelithiasis, without evidence of acute cholecystitis. Electronically Signed   By: Aram Candela M.D.   On: 12/08/2022 00:57    Medications Ordered in ED Medications  HYDROmorphone (DILAUDID) injection 0.5 mg (0.5 mg Intravenous Given 12/08/22 0027)  sodium chloride 0.9 % bolus 1,000 mL (0 mLs Intravenous Stopped 12/08/22 0057)    Followed by  0.9 %  sodium chloride infusion (1,000 mLs  Intravenous New Bag/Given 12/08/22 0141)  ondansetron (ZOFRAN) injection 4 mg (4 mg Intravenous Given 12/08/22 0026)  iohexol (OMNIPAQUE) 350 MG/ML injection 75 mL (75 mLs Intravenous Contrast Given 12/08/22 0123)   Procedures Procedures  (including critical care time) Medical Decision Making / ED Course   Medical Decision Making Amount and/or Complexity of Data Reviewed Labs: ordered. Decision-making details documented in ED Course. Radiology: ordered and independent interpretation performed. Decision-making details documented in ED Course.  Risk Prescription drug management. Parenteral controlled substances. Decision regarding hospitalization.    Epigastric abdominal pain Differential includes gastritis, biliary disease, pancreatitis.  Doubt cardiac process.  Patient given IV fluids, antiemetics and IV pain medicine.  CBC without leukocytosis or anemia. CMP with mild hypokalemia.  Mild hyperglycemia without DKA.  No renal insufficiency.  Evidence of biliary obstruction as well as pancreatitis with a lipase greater than 4000. Right upper quadrant ultrasound notable for cholelithiasis without evidence of acute cholecystitis or choledocholithiasis. Possibly passed stone CT scan consistent with pancreatitis.  Spoke with IM team who will admit.    Final Clinical Impression(s) / ED Diagnoses Final diagnoses:  Acute gallstone pancreatitis    This chart was dictated using voice recognition software.  Despite best efforts to proofread,  errors can occur which can change the documentation meaning.    Nira Conn, MD 12/08/22 [phone]

## 2022-12-08 NOTE — Hospital Course (Addendum)
Sarah Levy is a 37 y.o. with PMHx significant for preE/HTN with C section 09/2022, hypothyroid, GERD presenting with weeks of colicky abdominal pain which progressed to constant abdominal pain for ~12 hours prior to presentation with vomiting who was admitted for acute pancreatitis in the setting of cholelithiasis s/p laparoscopic cholecystectomy on 6/27.  Gallstone pancreatitis without complication Patient admitted for acute sharp epigastric pain with radiation to the back. Found to have lipase >4000, cholelithiasis without cholecystitis or choledocholithiasis on abdominal US. CT abdomen with peripancreatic inflammatory fat stranding consistent with pancreatitis. No pseudocyst formation or pancreatic ductal dilatation seen. Patient recovered with IVF resuscitation and pain management, tolerating PO intake on HD1 without nausea. General surgery was consulted on day of admission; she underwent uncomplicated laparoscopic cholecystectomy on 12/09/22.  Proteinuria Normal renal function with proteinuria on U/A.  Will need urine microalbumin ratio in outpatient setting.  Stable medical conditions:  HTN - Restart labetalol at 800 mg TID - Establishing pt with PCP  GERD - Protonix  Hypothyroid - Levo

## 2022-12-08 NOTE — ED Notes (Signed)
Assumed care of patient.

## 2022-12-08 NOTE — Progress Notes (Addendum)
Subjective:  Sarah Levy is a 37 y.o. F with a PMHx significant for preE/HTN with C section in 09/2022, hypothyroidism, GERD, presenting with colicky abdominal pain for weeks most recently with 12 hours of continuous abdominal pain with vomiting admitted for acute pancreatitis.  Pt was examined at bedside. She reports she is feeling better on exam today. She does continue to report some abdominal pain, improved, in her epigastric region and LUQ. This is a 4/10 in severity at rest and increases to a 6/10 with movement. No radiation. She denies N/V, SOB. She reports drinking multiple drinks without issue. She has also eaten graham crackers without issue and is hoping to start eating a bit more substantial food.  Consulted the General Surgery PA, [name], who stated that they will see the pt later today with a plan to do cholecystectomy while she is inpatient during this hospitalization.  Objective:  Vital signs in last 24 hours: Vitals:   12/08/22 0448 12/08/22 0500 12/08/22 0544 12/08/22 0736  BP:  (!) 150/96 (!) 144/85 123/72  Pulse:  66 74 69  Resp:  15 18 18   Temp: 97.8 F (36.6 C) 97.8 F (36.6 C) 98 F (36.7 C) 98 F (36.7 C)  TempSrc: Oral Oral    SpO2:  98% 97% 96%  Weight:      Height:       Weight change:   Intake/Output Summary (Last 24 hours) at 12/08/2022 1116 Last data filed at 12/08/2022 0057 Gross per 24 hour  Intake 1000 ml  Output --  Net 1000 ml   Physical Exam General: Pt is sitting half-up in bed, appears comfortable, in no acute distress. Cardiovascular: RRR, no murmurs, rubs, gallops. Pulmonary: Normal work of breathing. Lungs clear to auscultation bilaterally. Abdomen: Normal bowel sounds. Soft and nondistended in all four quadrants. Tenderness to palpation in epigastric region, LUQ; no rebound tenderness, no guarding. Neuro/Psych: Alert and oriented to person, place, event. (Time not formerly assessed.) Normal affect.     Latest Ref Rng & Units  12/08/2022    3:45 AM 12/07/2022    7:53 PM 10/07/2022    5:16 AM  CMP  Glucose 70 - 99 mg/dL 161  096  89   BUN 6 - 20 mg/dL 7  6  7    Creatinine 0.44 - 1.00 mg/dL 0.45  4.09  8.11   Sodium 135 - 145 mmol/L 140  140  139   Potassium 3.5 - 5.1 mmol/L 3.7  3.0  4.5   Chloride 98 - 111 mmol/L 101  107  105   CO2 22 - 32 mmol/L 25  26  25    Calcium 8.9 - 10.3 mg/dL 8.9  8.4  8.7   Total Protein 6.5 - 8.1 g/dL 6.3  6.0  5.8   Total Bilirubin 0.3 - 1.2 mg/dL 1.2  2.1  0.3   Alkaline Phos 38 - 126 U/L 240  226  85   AST 15 - 41 U/L 306  376  22   ALT 0 - 44 U/L 524  478  29        Latest Ref Rng & Units 12/07/2022    7:53 PM 10/07/2022    5:16 AM 10/05/2022    4:57 AM  CBC  WBC 4.0 - 10.5 K/uL 9.0  12.3  18.3   Hemoglobin 12.0 - 15.0 g/dL 91.4  9.9  78.2   Hematocrit 36.0 - 46.0 % 37.9  30.5  32.5   Platelets 150 -  400 K/uL 426  438  455     Lipase     Component Value Date/Time   LIPASE 4,825 (H) 12/07/2022 1953   Triglycerides 6/26: 112  Urinalysis: Proteinuria Negative pregnancy, HIV non reactive  Assessment/Plan:  Principal Problem:   Acute gallstone pancreatitis  Acute gallstone pancreatitis Pt reports feeling improved abdominal pain and is tolerating hydration po and simple foods without N/V/pain. - Will stop IV NS and switch to po fluids only - Advance diet as tolerated - Pain control with prn oxycodone 5 mg every 4 hours, prn hydromorphone 0.5 to 1 mg IV every 2 hr - Bowel regimen of Senokot S nightly and Miralax daily given opioid therapy - Repeat labs: CMP (follow liver enzymes) - Gen Surg to consult regarding cholecystectomy while inpatient - Consider ACR inpatient vs outpatient  Hypertension Preeclampsia - Continue to hold labetalol until pain under better control; can consider restarting pending BPs  Hypothyroidism - Levothyroxine 88 mcg daily  GERD - Protonix 40 mg daily  Level of care: MedSurg Diet: Full liquid, low-fat IVF: NS 125 mL/h VTE:  enoxaparin (LOVENOX) injection 40 mg Start: 12/08/22 0315 Code: Full Surrogate: Wife, Deon Pilling   LOS: 0 days   Governor Rooks, Medical Student 12/08/2022, 11:16 AM  Attestation for Student Documentation:  I personally was present and re-performed the history, physical exam and medical decision-making activities of this service and have verified that the service and findings are accurately documented in the student's note.  General surgery service was consulted; cholecystectomy planned during this hospitalization. Will be NPO at midnight for potential surgical intervention in the AM if improvement in LFTs and lipase.  Morene Crocker, MD 12/08/2022, 3:04 PM

## 2022-12-08 NOTE — Consult Note (Signed)
Consult Note  Sarah Levy 1985/08/06  [id number].    Requesting MD: Dr. Welton Flakes Chief Complaint/Reason for Consult: gallstone pancreatitis  HPI:  37 y.o. female with medical history significant for acid reflux, hypertension, hypothyroidism, asthma who presented to [facility] ED with abdominal pain. Pain began acutely yesterday and she described it as sharp in the epigastrium with radiation to left upper quadrant and back.  She has had associated vomiting. She has had similar episodes of intermittent epigastric pain over the last several weeks which were less severe and improved with ibuprofen and rest.  She noted that pain was worse with eating.  She had a cesarean section delivery on 10/04/2022 and had been attributing her symptoms to that. She denies fever, chills, change in bowel movements, respiratory or urinary complaints.  Work up in ED significant for ultrasound showing cholelithiasis without acute cholecystitis and CT with severe pancreatitis and possible associated duodenitis.  Lipase > 4000.  Triglycerides normal.  She has been admitted to the IM teaching service and general surgery asked to see. Does not appear GI has been consulted.   Currently, pain is located in epigastric abdomen and is sharp in nature/intermittent, improved with pain medications.  She is not currently nauseous.  Last bowel movement yesterday. PO intake does not seem to be worsening pain.  Substance use: Former cigarette smoker, denies alcohol use Allergies: Sulfa antibiotics-throat swelling Blood thinners: None Past abdominal surgeries: Cesarean section  ROS: Reviewed and as above  Family History  Problem Relation Age of Onset   High Cholesterol Mother    Diabetes Father    High blood pressure Father    High Cholesterol Father    Heart disease Father    Sudden death Father     Past Medical History:  Diagnosis Date   Acid reflux    Allergies    Asthma    sports or allergies induced   Back  pain    Hypertension    Hypothyroidism    Joint pain    Pre-eclampsia    SOB (shortness of breath)     Past Surgical History:  Procedure Laterality Date   CESAREAN SECTION N/A 10/04/2022   Procedure: CESAREAN SECTION;  Surgeon: Olivia Mackie, MD;  Location: [facility] LD ORS;  Service: Obstetrics;  Laterality: N/A;   WISDOM TOOTH EXTRACTION      Social History:  reports that she quit smoking about 16 years ago. Her smoking use included cigarettes. She has never used smokeless tobacco. She reports that she does not currently use alcohol. She reports that she does not use drugs.  Allergies:  Allergies  Allergen Reactions   Sulfa Antibiotics     Feels like throat is closing   Tylenol [Acetaminophen]     Itchy throat, feels like it is closing    Medications Prior to Admission  Medication Sig Dispense Refill   cetirizine (ZYRTEC) 5 MG tablet Take 10 mg by mouth daily.     cholecalciferol (VITAMIN D3) 25 MCG (1000 UNIT) tablet Take 1,000 Units by mouth daily.     ibuprofen (ADVIL) 600 MG tablet Take 1 tablet (600 mg total) by mouth every 6 (six) hours. 30 tablet 0   labetalol (NORMODYNE) 200 MG tablet Take 4 tablets (800 mg total) by mouth every 8 (eight) hours. 90 tablet 0   levothyroxine (SYNTHROID) 88 MCG tablet Take 88 mcg by mouth daily before breakfast.     albuterol (VENTOLIN HFA) 108 (90 Base) MCG/ACT inhaler Inhale 1-2 puffs  into the lungs every 6 (six) hours as needed for wheezing or shortness of breath.     busPIRone (BUSPAR) 10 MG tablet Take 1 tablet (10 mg total) by mouth 2 (two) times daily as needed (anxiety). 60 tablet 0   Magnesium 400 MG TABS Take 400 mg by mouth daily.     NIFEdipine (ADALAT CC) 60 MG 24 hr tablet Take 1 tablet (60 mg total) by mouth 2 (two) times daily. 60 tablet 0   oxyCODONE (OXY IR/ROXICODONE) 5 MG immediate release tablet Take 1 tablet (5 mg total) by mouth every 4 (four) hours as needed for moderate pain. 30 tablet 0   pantoprazole (PROTONIX) 40 MG  tablet Take 40 mg by mouth daily.     potassium chloride (KLOR-CON) 10 MEQ tablet Take 40 mEq by mouth daily.     Prenatal Vit-Fe Fumarate-FA (MULTIVITAMIN-PRENATAL) 27-0.8 MG TABS tablet Take 1 tablet by mouth daily at 12 noon.     Prenatal Vit-Fe Fumarate-FA (PRENATAL MULTIVITAMIN) TABS tablet Take 1 tablet by mouth daily at 12 noon.     sertraline (ZOLOFT) 25 MG tablet Take 1 tablet (25 mg total) by mouth daily. 39 tablet 0    Blood pressure 123/72, pulse 69, temperature 98 F (36.7 C), resp. rate 18, height 5\' 4"  (1.626 m), weight 100 kg, SpO2 96 %, unknown if currently breastfeeding. Physical Exam: General: pleasant, WD, WN female who is laying in bed in NAD HEENT: head is normocephalic, atraumatic.  Sclera are anicteric. EOMs intact.  Ears and nose without any masses or lesions.  Mouth is pink and moist Heart: regular, rate, and rhythm.  Lungs: Respiratory effort nonlabored Abd: soft, mild ttp in epigastric abdomen with some fullness to palpation, no peritonitis, ND, no masses, hernias, or organomegaly MSK: all 4 extremities are symmetrical with no cyanosis, clubbing, or edema Skin: warm and dry with no masses, lesions, or rashes Neuro: Cranial nerves 2-12 grossly intact, sensation is normal throughout Psych: A&Ox3 with an appropriate affect.    Results for orders placed or performed during the hospital encounter of 12/07/22 (from the past 48 hour(s))  Urinalysis, Routine w reflex microscopic -Urine, Clean Catch     Status: Abnormal   Collection Time: 12/07/22  7:52 PM  Result Value Ref Range   Color, Urine AMBER (A) YELLOW    Comment: BIOCHEMICALS MAY BE AFFECTED BY COLOR   APPearance HAZY (A) CLEAR   Specific Gravity, Urine 1.012 1.005 - 1.030   pH 5.0 5.0 - 8.0   Glucose, UA NEGATIVE NEGATIVE mg/dL   Hgb urine dipstick NEGATIVE NEGATIVE   Bilirubin Urine NEGATIVE NEGATIVE   Ketones, ur NEGATIVE NEGATIVE mg/dL   Protein, ur 30 (A) NEGATIVE mg/dL   Nitrite NEGATIVE NEGATIVE    Leukocytes,Ua NEGATIVE NEGATIVE   RBC / HPF 0-5 0 - 5 RBC/hpf   WBC, UA 0-5 0 - 5 WBC/hpf   Bacteria, UA RARE (A) NONE SEEN   Squamous Epithelial / HPF 0-5 0 - 5 /HPF   Mucus PRESENT     Comment: Performed at [facility], [address], [place], [place] [address]  Pregnancy, urine     Status: None   Collection Time: 12/07/22  7:52 PM  Result Value Ref Range   Preg Test, Ur NEGATIVE NEGATIVE    Comment:        THE SENSITIVITY OF THIS METHODOLOGY IS >25 mIU/mL. Performed at [facility], [address] [place], [place] [address]   CBC with  Differential     Status: Abnormal   Collection Time: 12/07/22  7:53 PM  Result Value Ref Range   WBC 9.0 4.0 - 10.5 K/uL   RBC 4.53 3.87 - 5.11 MIL/uL   Hemoglobin 12.0 12.0 - 15.0 g/dL   HCT 27.2 53.6 - 64.4 %   MCV 83.7 80.0 - 100.0 fL   MCH 26.5 26.0 - 34.0 pg   MCHC 31.7 30.0 - 36.0 g/dL   RDW 03.4 74.2 - 59.5 %   Platelets 426 (H) 150 - 400 K/uL   nRBC 0.0 0.0 - 0.2 %   Neutrophils Relative % 81 %   Neutro Abs 7.2 1.7 - 7.7 K/uL   Lymphocytes Relative 11 %   Lymphs Abs 1.0 0.7 - 4.0 K/uL   Monocytes Relative 6 %   Monocytes Absolute 0.5 0.1 - 1.0 K/uL   Eosinophils Relative 2 %   Eosinophils Absolute 0.2 0.0 - 0.5 K/uL   Basophils Relative 0 %   Basophils Absolute 0.0 0.0 - 0.1 K/uL   Immature Granulocytes 0 %   Abs Immature Granulocytes 0.04 0.00 - 0.07 K/uL    Comment: Performed at [facility], [address], [place], [place] [address]  Comprehensive metabolic panel     Status: Abnormal   Collection Time: 12/07/22  7:53 PM  Result Value Ref Range   Sodium 140 135 - 145 mmol/L   Potassium 3.0 (L) 3.5 - 5.1 mmol/L   Chloride 107 98 - 111 mmol/L   CO2 26 22 - 32 mmol/L   Glucose, Bld 135 (H) 70 - 99 mg/dL    Comment: Glucose reference range applies only to samples taken after fasting for at least 8 hours.   BUN 6 6 - 20 mg/dL   Creatinine, Ser 6.43 0.44 - 1.00 mg/dL   Calcium 8.4 (L) 8.9 - 10.3 mg/dL    Total Protein 6.0 (L) 6.5 - 8.1 g/dL   Albumin 3.4 (L) 3.5 - 5.0 g/dL   AST 329 (H) 15 - 41 U/L   ALT 478 (H) 0 - 44 U/L   Alkaline Phosphatase 226 (H) 38 - 126 U/L   Total Bilirubin 2.1 (H) 0.3 - 1.2 mg/dL   GFR, Estimated >51 >88 mL/min    Comment: (NOTE) Calculated using the CKD-EPI Creatinine Equation (2021)    Anion gap 7 5 - 15    Comment: Performed at [facility], [address], [place], [place] [address]  Lipase, blood     Status: Abnormal   Collection Time: 12/07/22  7:53 PM  Result Value Ref Range   Lipase 4,825 (H) 11 - 51 U/L    Comment: RESULT CONFIRMED BY MANUAL DILUTION Performed at [facility], [address], [place], [place] [address]   Triglycerides     Status: None   Collection Time: 12/07/22  7:53 PM  Result Value Ref Range   Triglycerides 112 <150 mg/dL    Comment: Performed at [facility], [address] [place], [place] [address]  HIV Antibody (routine testing w rflx)     Status: None   Collection Time: 12/08/22  3:45 AM  Result Value Ref Range   HIV Screen 4th Generation wRfx Non Reactive Non Reactive    Comment: Performed at [facility], [address] [place], [place] [address]  Comprehensive metabolic panel     Status: Abnormal   Collection Time: 12/08/22  3:45 AM  Result Value Ref Range  Sodium 140 135 - 145 mmol/L   Potassium 3.7 3.5 - 5.1 mmol/L   Chloride 101 98 - 111 mmol/L   CO2 25 22 - 32 mmol/L   Glucose, Bld 114 (H) 70 - 99 mg/dL    Comment: Glucose reference range applies only to samples taken after fasting for at least 8 hours.   BUN 7 6 - 20 mg/dL   Creatinine, Ser 8.11 0.44 - 1.00 mg/dL   Calcium 8.9 8.9 - 91.4 mg/dL   Total Protein 6.3 (L) 6.5 - 8.1 g/dL   Albumin 3.5 3.5 - 5.0 g/dL   AST 782 (H) 15 - 41 U/L   ALT 524 (H) 0 - 44 U/L   Alkaline Phosphatase 240 (H) 38 - 126 U/L   Total Bilirubin 1.2 0.3 - 1.2 mg/dL   GFR, Estimated >95 >62 mL/min    Comment: (NOTE) Calculated using the CKD-EPI  Creatinine Equation (2021)    Anion gap 14 5 - 15    Comment: Performed at [facility], [address], [place], [place] [address]   CT ABDOMEN PELVIS W CONTRAST  Result Date: 12/08/2022 CLINICAL DATA:  Intermittent epigastric pain. EXAM: CT ABDOMEN AND PELVIS WITH CONTRAST TECHNIQUE: Multidetector CT imaging of the abdomen and pelvis was performed using the standard protocol following bolus administration of intravenous contrast. RADIATION DOSE REDUCTION: This exam was performed according to the departmental dose-optimization program which includes automated exposure control, adjustment of the mA and/or kV according to patient size and/or use of iterative reconstruction technique. CONTRAST:  75mL OMNIPAQUE IOHEXOL 350 MG/ML SOLN COMPARISON:  None Available. FINDINGS: Lower chest: No acute abnormality. Hepatobiliary: There is diffuse fatty infiltration of the liver parenchyma. No focal liver abnormality is seen. No gallstones, gallbladder wall thickening, or biliary dilatation. Pancreas: There is marked severity peripancreatic inflammatory fat stranding and peripancreatic fluid. No pancreatic ductal dilatation is identified. Spleen: Normal in size without focal abnormality. Adrenals/Urinary Tract: Adrenal glands are unremarkable. Kidneys are normal, without renal calculi, focal lesion, or hydronephrosis. Bladder is unremarkable. Stomach/Bowel: Stomach is within normal limits. Appendix appears normal. No evidence of bowel dilatation. Peri duodenal inflammatory fat stranding is seen which appears to originate from the pancreas. Noninflamed diverticula are seen throughout the large bowel. Vascular/Lymphatic: No significant vascular findings are present. No enlarged abdominal or pelvic lymph nodes. Reproductive: Uterus and bilateral adnexa are unremarkable. Other: No abdominal wall hernia or abnormality. No abdominopelvic ascites. Musculoskeletal: No acute or significant osseous findings. IMPRESSION: 1.  Marked severity acute pancreatitis. 2. Duodenitis involving the adjacent portion of the proximal duodenum cannot be excluded. 3. Colonic diverticulosis. Electronically Signed   By: Aram Candela M.D.   On: 12/08/2022 01:39   US Abdomen Limited RUQ (LIVER/GB)  Result Date: 12/08/2022 CLINICAL DATA:  Epigastric pain. EXAM: ULTRASOUND ABDOMEN LIMITED RIGHT UPPER QUADRANT COMPARISON:  None Available. FINDINGS: Gallbladder: Multiple small, shadowing echogenic gallstones are seen within the gallbladder fundus. The largest measures approximately 6 mm. The gallbladder wall measures 3.7 mm in thickness. No sonographic Murphy sign noted by sonographer. Common bile duct: Diameter: 2.2 mm Liver: No focal lesion identified. Within normal limits in parenchymal echogenicity. Portal vein is patent on color Doppler imaging with normal direction of blood flow towards the liver. Other: None. IMPRESSION: Cholelithiasis, without evidence of acute cholecystitis. Electronically Signed   By: Aram Candela M.D.   On: 12/08/2022 00:57      Assessment/Plan Biliary pancreatitis Transaminitis  Patient seen and examined and relevant labs and imaging personally reviewed.  Workup and exam are consistent with acute pancreatitis without complicating features such as pseudocyst.  Lipase greater than 4000.  Liver enzymes are elevated as well.  Total bilirubin elevated on admission but now normal.  Recommend continue to trend.  Will order repeat lipase for tomorrow AM.  She is tolerating p.o. intake without worsening pain.  Ultimately, recommend laparoscopic cholecystectomy due to risk for recurrent biliary pancreatitis.  Await repeat labs.  Will make n.p.o. at midnight for possible OR as early as tomorrow.   FEN: Regular, NPO MN, IVF per primary ID: None VTE: Lovenox  Per primary: Hypothyroidism GERD Hypertension  I reviewed hospitalist notes, last 24 h vitals and pain scores, last 48 h intake and output, last 24 h labs  and trends, and last 24 h imaging results.   Trixie Deis, [facility] 12/08/2022, 11:20 AM Please see Amion for pager number during day hours 7:00am-4:30pm

## 2022-12-08 NOTE — ED Notes (Signed)
Patient is breastfeeding and has brought her own supplies. Mom is at bedside.

## 2022-12-08 NOTE — TOC CM/SW Note (Signed)
Transition of Care [facility]) - Inpatient Brief Assessment   Patient Details  Name: Sarah Levy MRN: [id number] Date of Birth: 08-Jan-1986  Transition of Care [facility]) CM/SW Contact:    Harriet Masson, RN Phone Number: 12/08/2022, 10:03 AM   Clinical Narrative:  Patient admitted for acute gallstones pancreatitis.  Advanced to Regular diet.  TOC following.  Transition of Care Asessment: Insurance and Status: Insurance coverage has been reviewed Patient has primary care physician: Yes Home environment has been reviewed: safe to discharge home Prior level of function:: independent Prior/Current Home Services: No current home services Social Determinants of Health Reivew: SDOH reviewed no interventions necessary Readmission risk has been reviewed: Yes Transition of care needs: no transition of care needs at this time

## 2022-12-08 NOTE — H&P (Addendum)
Date: 12/08/2022         Patient Name:  Sarah Levy MRN: [id number]  DOB: Jan 02, 1986 Age / Sex: 37 y.o., female   PCP: Olivia Mackie, MD         Medical Service: Internal Medicine Teaching Service         Attending Physician: Dr. Mercie Eon, MD    First Contact: Dr. Lily Kocher Pager: [phone]  Second Contact: Dr. Cyndie Chime Pager: [phone]       After Hours (After 5p/  First Contact Pager: [phone]  weekends / holidays): Second Contact Pager: [phone]   Chief Concern: Abdominal pain, nausea, vomiting.  History of Present Illness: 37 year old female presents to the emergency department with 12 hours of abdominal pain.  Onset was acute while she was at her desk working from.  It is described as a sharp pain in the epigastrium and left upper quadrant, radiates to the back.  Constant and steadily worsening throughout the afternoon into the evening, described as the worst pain of her life. Today she had 4 episodes of vomiting.  Over the last several weeks, she has had intermittent epigastric pain of a similar quality but much less severe.  Until tonight the pain responded well to ibuprofen and bed rest.  She recently had a baby and attributed her pain to sequelae of labor and infant care.  Workup in the emergency department strongly suggestive of pancreatitis.  Fluids and pain medicine were started.  At the time of my interview she is much more comfortable with her pain around a 4, down from 10.  Review of Systems  Constitutional:  Negative for fever.  Respiratory:  Negative for shortness of breath.   Cardiovascular:  Negative for chest pain.  Gastrointestinal:  Positive for abdominal pain, constipation, nausea and vomiting.  Genitourinary:  Negative for frequency (No decreased urinary frequency.).  Skin:        Denies yellowing of skin or eyes  Neurological:  Negative for dizziness.   Allergies: Allergies  Allergen Reactions   Sulfa Antibiotics     Feels like throat is  closing   Tylenol [Acetaminophen]     Itchy throat, feels like it is closing   Past Medical History: Reviewed, notable for preeclampsia during recent pregnancy, culminating in cesarean delivery in April 2024.  Also GERD, hypothyroidism, and class II obesity.  Medications: Current Outpatient Medications  Medication Instructions   albuterol (VENTOLIN HFA) 108 (90 Base) MCG/ACT inhaler 1-2 puffs, Inhalation, Every 6 hours PRN   busPIRone (BUSPAR) 10 mg, Oral, 2 times daily PRN   cetirizine (ZYRTEC) 10 mg, Oral, Daily   cholecalciferol (VITAMIN D3) 1,000 Units, Oral, Daily   ibuprofen (ADVIL) 600 mg, Oral, Every 6 hours   labetalol (NORMODYNE) 800 mg, Oral, Every 8 hours   levothyroxine (SYNTHROID) 88 mcg, Oral, Daily before breakfast   Magnesium 400 mg, Oral, Daily   NIFEdipine (ADALAT CC) 60 mg, Oral, 2 times daily   oxyCODONE (OXY IR/ROXICODONE) 5 mg, Oral, Every 4 hours PRN   pantoprazole (PROTONIX) 40 mg, Oral, Daily   potassium chloride (KLOR-CON) 10 MEQ tablet 40 mEq, Oral, Daily   Prenatal Vit-Fe Fumarate-FA (MULTIVITAMIN-PRENATAL) 27-0.8 MG TABS tablet 1 tablet, Oral, Daily   Prenatal Vit-Fe Fumarate-FA (PRENATAL MULTIVITAMIN) TABS tablet 1 tablet, Oral, Daily   sertraline (ZOLOFT) 25 mg, Oral, Daily   Reports good adherence to her labetalol, levothyroxine, and pantoprazole.  Also takes prenatal vitamins, the occasional antihistamine for allergies, and a vitamin D supplement.  Does  not look like she is currently using BuSpar or sertraline on a regular basis.  Surgical History: Reviewed, notable for cesarean section in April 2024.  No history of cholecystectomy or appendectomy.  Family History:  Reviewed, notable for heart disease in father, A-fib in mother.  No family history of gallbladder disease.  Social History:  Lives in [place] with her wife, 79-year-old, and 84-month-old children.  Works remotely for a [employer].  Enjoys spending time with family and taking care of  her kids when she is healthy and feeling well.  Just came off maternity leave and is apprehensive about leaving work again for this acute illness.  Does not currently use alcohol.  Denies drug use and tobacco use.  Physical Exam: Blood pressure (!) 143/96, pulse 68, temperature 97.8 F (36.6 C), resp. rate 16, height 5\' 4"  (1.626 m), weight 100 kg, SpO2 98 %, unknown if currently breastfeeding.  No apparent acute discomfort Dry mucous membranes Heart rate is normal, rhythm is regular, radial pulses are strong, no lower extremity edema Breathing is regular and unlabored on room air, anterior lung sounds are clear Abdomen is tender in the epigastrium and left upper quadrant, no overlying bruising, no right upper quadrant tenderness Skin is warm and dry Alert and oriented, speech is normal, no facial asymmetry, tongue is midline, symmetric palatal elevation, moving all extremities spontaneously Pleasant, concordant affect  EKG:  Sinus rhythm, QTc 488, otherwise no concerning ST segment abnormalities  Labs: Sodium 140 Potassium 3 Bicarb 26 Glucose 135 BUN/creatinine 6/0.92  Alk phos 226 AST/ALT 376/478 T. bili 2.1 Lipase 4825  WBC 9 Hemoglobin 12 Platelets 426  UA with some proteinuria, otherwise bland  Images and other studies: CT abdomen pelvis with contrast shows peripancreatic inflammation and colonic diverticulosis  Right upper quadrant ultrasound shows gallstones, no biliary ductal dilatation, no acute cholecystitis  Assessment & Plan:  KEIONA JENISON is a 37 y.o. who presents with epigastric abdominal pain and is found to have pancreatitis likely due to passage of a gallstone.  Principal Problem:   Acute gallstone pancreatitis  Acute gallstone pancreatitis Her colicky type pain over the last few weeks culminating in her presentation tonight makes me think of biliary colic and I wonder if she passed a stone tonight.  Her liver enzymes are consistent with  obstruction but there is no choledocholithiasis on ultrasound and she seems to be improving with initiation of fluids and pain medicine.  No culprit medications on med rec, triglycerides are normal, and she does not use alcohol.  So far she got a liter bolus and has been on 125 cc/h of normal saline.  Her vital signs are good, creatinine is elevated from baseline but she has good urine output.  No signs of volume overload.  Continue fluids at current rate.  Pain currently well-controlled.  Has an appetite, recommend starting with low-fat foods if she feels like eating.  She can probably get a cholecystectomy during this admission to prevent recurrent episodes and cholangitis as RUQ is notable for cholelithiasis.  - NS at 150 mL/h - Oxycodone 5 mg every 4 hours as needed - Hydromorphone 0.5 to 1 mg IV every 2 hours as needed - Advance diet as tolerated - Trend liver enzymes --Gen Surgery Consult in the AM  Hypertension Preeclampsia Was started on labetalol during pregnancy for high blood pressure.  Holding this medication for now.  Hypothyroidism Maintained on levothyroxine - Levothyroxine 88 mcg daily  History of GERD - Home Protonix 40 mg  daily  Constipation Had some constipation at home.  Last bowel movement was day before admission.  Starting bowel regimen given opioid therapy for pain medicine. - Senokot S nightly - MiraLAX daily  Level of care: MedSurg Diet: Full liquid, low-fat IVF: NS 125 mL/h VTE: enoxaparin (LOVENOX) injection 40 mg Start: 12/08/22 0315 Code: Full Surrogate: Wife, Deon Pilling  Signed: Marrianne Mood MD 12/08/2022, 3:16 AM

## 2022-12-09 ENCOUNTER — Encounter (HOSPITAL_COMMUNITY)
Admission: EM | Disposition: A | Discharge status: Home / Self Care | Payer: Self-pay | Source: Home / Self Care | Attending: Internal Medicine

## 2022-12-09 ENCOUNTER — Inpatient Hospital Stay (HOSPITAL_COMMUNITY): Payer: BC Managed Care – PPO | Admitting: Anesthesiology

## 2022-12-09 ENCOUNTER — Other Ambulatory Visit: Payer: Self-pay

## 2022-12-09 ENCOUNTER — Encounter (HOSPITAL_COMMUNITY): Payer: Self-pay | Admitting: Internal Medicine

## 2022-12-09 DIAGNOSIS — K851 Biliary acute pancreatitis without necrosis or infection: Secondary | ICD-10-CM

## 2022-12-09 DIAGNOSIS — I10 Essential (primary) hypertension: Secondary | ICD-10-CM

## 2022-12-09 DIAGNOSIS — K219 Gastro-esophageal reflux disease without esophagitis: Secondary | ICD-10-CM

## 2022-12-09 DIAGNOSIS — Z87891 Personal history of nicotine dependence: Secondary | ICD-10-CM

## 2022-12-09 DIAGNOSIS — E785 Hyperlipidemia, unspecified: Secondary | ICD-10-CM

## 2022-12-09 HISTORY — PX: CHOLECYSTECTOMY: SHX55

## 2022-12-09 LAB — COMPREHENSIVE METABOLIC PANEL
ALT: 289 U/L — ABNORMAL HIGH (ref 0–44)
AST: 77 U/L — ABNORMAL HIGH (ref 15–41)
Albumin: 3.3 g/dL — ABNORMAL LOW (ref 3.5–5.0)
Alkaline Phosphatase: 196 U/L — ABNORMAL HIGH (ref 38–126)
Anion gap: 8 (ref 5–15)
BUN: <5 mg/dL — ABNORMAL LOW (ref 6–20)
CO2: 24 mmol/L (ref 22–32)
Calcium: 8.1 mg/dL — ABNORMAL LOW (ref 8.9–10.3)
Chloride: 103 mmol/L (ref 98–111)
Creatinine, Ser: 0.86 mg/dL (ref 0.44–1.00)
GFR, Estimated: >60 mL/min (ref >60)
Glucose, Bld: 98 mg/dL (ref 70–99)
Potassium: 3.7 mmol/L (ref 3.5–5.1)
Sodium: 135 mmol/L (ref 135–145)
Total Bilirubin: 0.8 mg/dL (ref 0.3–1.2)
Total Protein: 6 g/dL — ABNORMAL LOW (ref 6.5–8.1)

## 2022-12-09 LAB — CBC
HCT: 37.9 % (ref 36.0–46.0)
Hemoglobin: 11.7 g/dL — ABNORMAL LOW (ref 12.0–15.0)
MCH: 26.4 pg (ref 26.0–34.0)
MCHC: 30.9 g/dL (ref 30.0–36.0)
MCV: 85.4 fL (ref 80.0–100.0)
Platelets: 313 10*3/uL (ref 150–400)
RBC: 4.44 MIL/uL (ref 3.87–5.11)
RDW: 13.6 % (ref 11.5–15.5)
WBC: 11.4 10*3/uL — ABNORMAL HIGH (ref 4.0–10.5)
nRBC: 0 % (ref 0.0–0.2)

## 2022-12-09 LAB — LIPASE, BLOOD: Lipase: 860 U/L — ABNORMAL HIGH (ref 11–51)

## 2022-12-09 SURGERY — LAPAROSCOPIC CHOLECYSTECTOMY
Anesthesia: General | Site: Abdomen

## 2022-12-09 MED ORDER — AMISULPRIDE (ANTIEMETIC) 5 MG/2ML IV SOLN: 10.0000 mg | Freq: Once | INTRAVENOUS | Status: DC | PRN

## 2022-12-09 MED ORDER — FENTANYL CITRATE (PF) 100 MCG/2ML IJ SOLN
25.0000 ug | INTRAMUSCULAR | Status: DC | PRN
  Administered 2022-12-09 (×2): 50 ug via INTRAVENOUS

## 2022-12-09 MED ORDER — LIDOCAINE 2% (20 MG/ML) 5 ML SYRINGE
INTRAMUSCULAR | Status: AC
  Filled 2022-12-09: qty 5

## 2022-12-09 MED ORDER — ROCURONIUM BROMIDE 10 MG/ML (PF) SYRINGE
PREFILLED_SYRINGE | INTRAVENOUS | Status: AC
  Filled 2022-12-09: qty 10

## 2022-12-09 MED ORDER — 0.9 % SODIUM CHLORIDE (POUR BTL) OPTIME
TOPICAL | Status: DC | PRN
  Administered 2022-12-09: 1000 mL

## 2022-12-09 MED ORDER — PROPOFOL 10 MG/ML IV BOLUS
INTRAVENOUS | Status: AC
  Filled 2022-12-09: qty 20

## 2022-12-09 MED ORDER — LABETALOL HCL 5 MG/ML IV SOLN
INTRAVENOUS | Status: DC | PRN
  Administered 2022-12-09: 10 mg via INTRAVENOUS

## 2022-12-09 MED ORDER — ONDANSETRON HCL 4 MG/2ML IJ SOLN
INTRAMUSCULAR | Status: DC | PRN
  Administered 2022-12-09: 4 mg via INTRAVENOUS

## 2022-12-09 MED ORDER — CHLORHEXIDINE GLUCONATE 0.12 % MT SOLN
15.0000 mL | Freq: Once | OROMUCOSAL | Status: AC
  Administered 2022-12-09: 15 mL via OROMUCOSAL

## 2022-12-09 MED ORDER — ONDANSETRON HCL 4 MG/2ML IJ SOLN
INTRAMUSCULAR | Status: AC
  Filled 2022-12-09: qty 2

## 2022-12-09 MED ORDER — FENTANYL CITRATE (PF) 100 MCG/2ML IJ SOLN
INTRAMUSCULAR | Status: AC
  Filled 2022-12-09: qty 2

## 2022-12-09 MED ORDER — BUPIVACAINE HCL (PF) 0.25 % IJ SOLN
INTRAMUSCULAR | Status: AC
  Filled 2022-12-09: qty 30

## 2022-12-09 MED ORDER — SUGAMMADEX SODIUM 200 MG/2ML IV SOLN
INTRAVENOUS | Status: DC | PRN
  Administered 2022-12-09: 200 mg via INTRAVENOUS

## 2022-12-09 MED ORDER — MIDAZOLAM HCL 2 MG/2ML IJ SOLN
INTRAMUSCULAR | Status: DC | PRN
  Administered 2022-12-09: 2 mg via INTRAVENOUS

## 2022-12-09 MED ORDER — FENTANYL CITRATE (PF) 250 MCG/5ML IJ SOLN
INTRAMUSCULAR | Status: AC
  Filled 2022-12-09: qty 5

## 2022-12-09 MED ORDER — CEFAZOLIN SODIUM-DEXTROSE 2-3 GM-%(50ML) IV SOLR
INTRAVENOUS | Status: DC | PRN
  Administered 2022-12-09: 2 g via INTRAVENOUS

## 2022-12-09 MED ORDER — MIDAZOLAM HCL 2 MG/2ML IJ SOLN
INTRAMUSCULAR | Status: AC
  Filled 2022-12-09: qty 2

## 2022-12-09 MED ORDER — DEXAMETHASONE SODIUM PHOSPHATE 10 MG/ML IJ SOLN
INTRAMUSCULAR | Status: AC
  Filled 2022-12-09: qty 1

## 2022-12-09 MED ORDER — KETOROLAC TROMETHAMINE 30 MG/ML IJ SOLN
INTRAMUSCULAR | Status: AC
  Filled 2022-12-09: qty 1

## 2022-12-09 MED ORDER — CEFAZOLIN SODIUM 1 G IJ SOLR
INTRAMUSCULAR | Status: AC
  Filled 2022-12-09: qty 20

## 2022-12-09 MED ORDER — SODIUM CHLORIDE 0.9 % IR SOLN
Status: DC | PRN
  Administered 2022-12-09: 1000 mL

## 2022-12-09 MED ORDER — ROCURONIUM BROMIDE 10 MG/ML (PF) SYRINGE
PREFILLED_SYRINGE | INTRAVENOUS | Status: DC | PRN
  Administered 2022-12-09: 60 mg via INTRAVENOUS

## 2022-12-09 MED ORDER — ORAL CARE MOUTH RINSE: 15.0000 mL | Freq: Once | OROMUCOSAL | Status: AC

## 2022-12-09 MED ORDER — LABETALOL HCL 5 MG/ML IV SOLN
INTRAVENOUS | Status: AC
  Filled 2022-12-09: qty 4

## 2022-12-09 MED ORDER — LIDOCAINE 2% (20 MG/ML) 5 ML SYRINGE
INTRAMUSCULAR | Status: DC | PRN
  Administered 2022-12-09: 100 mg via INTRAVENOUS

## 2022-12-09 MED ORDER — PROPOFOL 10 MG/ML IV BOLUS
INTRAVENOUS | Status: DC | PRN
  Administered 2022-12-09: 200 mg via INTRAVENOUS

## 2022-12-09 MED ORDER — DEXAMETHASONE SODIUM PHOSPHATE 10 MG/ML IJ SOLN
INTRAMUSCULAR | Status: DC | PRN
  Administered 2022-12-09: 10 mg via INTRAVENOUS

## 2022-12-09 MED ORDER — BUPIVACAINE HCL 0.25 % IJ SOLN
INTRAMUSCULAR | Status: DC | PRN
  Administered 2022-12-09: 14 mL

## 2022-12-09 MED ORDER — LACTATED RINGERS IV SOLN: INTRAVENOUS | Status: DC

## 2022-12-09 MED ORDER — SPY AGENT GREEN - (INDOCYANINE FOR INJECTION)
INTRAMUSCULAR | Status: DC | PRN
  Administered 2022-12-09: 2.5 mg via INTRAVENOUS

## 2022-12-09 MED ORDER — OXYCODONE HCL 5 MG/5ML PO SOLN: 5.0000 mg | Freq: Once | ORAL | Status: AC | PRN

## 2022-12-09 MED ORDER — FENTANYL CITRATE (PF) 250 MCG/5ML IJ SOLN
INTRAMUSCULAR | Status: DC | PRN
  Administered 2022-12-09: 50 ug via INTRAVENOUS
  Administered 2022-12-09 (×2): 100 ug via INTRAVENOUS

## 2022-12-09 MED ORDER — OXYCODONE HCL 5 MG PO TABS
ORAL_TABLET | ORAL | Status: AC
  Filled 2022-12-09: qty 1

## 2022-12-09 MED ORDER — OXYCODONE HCL 5 MG PO TABS
5.0000 mg | ORAL_TABLET | Freq: Once | ORAL | Status: AC | PRN
  Administered 2022-12-09: 5 mg via ORAL

## 2022-12-09 SURGICAL SUPPLY — 42 items
ADH SKN CLS APL DERMABOND .7 (GAUZE/BANDAGES/DRESSINGS) ×1
APL PRP STRL LF DISP 70% ISPRP (MISCELLANEOUS) ×1
BAG COUNTER SPONGE SURGICOUNT (BAG) ×1 IMPLANT
BAG SPEC RTRVL 10 TROC 200 (ENDOMECHANICALS) ×1
BAG SPNG CNTER NS LX DISP (BAG)
CANISTER SUCT 3000ML PPV (MISCELLANEOUS) ×1 IMPLANT
CHLORAPREP W/TINT 26 (MISCELLANEOUS) ×1 IMPLANT
CLIP LIGATING HEMO O LOK GREEN (MISCELLANEOUS) ×1 IMPLANT
COVER SURGICAL LIGHT HANDLE (MISCELLANEOUS) ×1 IMPLANT
COVER TRANSDUCER ULTRASND (DRAPES) ×1 IMPLANT
DERMABOND ADVANCED .7 DNX12 (GAUZE/BANDAGES/DRESSINGS) ×1 IMPLANT
ELECT REM PT RETURN 9FT ADLT (ELECTROSURGICAL) ×1
ELECTRODE REM PT RTRN 9FT ADLT (ELECTROSURGICAL) ×1 IMPLANT
GLOVE BIO SURGEON STRL SZ7.5 (GLOVE) ×2 IMPLANT
GOWN STRL REUS W/ TWL LRG LVL3 (GOWN DISPOSABLE) ×2 IMPLANT
GOWN STRL REUS W/ TWL XL LVL3 (GOWN DISPOSABLE) ×1 IMPLANT
GOWN STRL REUS W/TWL LRG LVL3 (GOWN DISPOSABLE) ×2
GOWN STRL REUS W/TWL XL LVL3 (GOWN DISPOSABLE) ×1
GRASPER SUT TROCAR 14GX15 (MISCELLANEOUS) ×1 IMPLANT
IRRIG SUCT STRYKERFLOW 2 WTIP (MISCELLANEOUS) ×1
IRRIGATION SUCT STRKRFLW 2 WTP (MISCELLANEOUS) ×1 IMPLANT
KIT BASIN OR (CUSTOM PROCEDURE TRAY) ×1 IMPLANT
KIT IMAGING PINPOINTPAQ (MISCELLANEOUS) IMPLANT
KIT TURNOVER KIT B (KITS) ×1 IMPLANT
NDL INSUFFLATION 14GA 120MM (NEEDLE) ×1 IMPLANT
NEEDLE INSUFFLATION 14GA 120MM (NEEDLE) ×1 IMPLANT
NS IRRIG 1000ML POUR BTL (IV SOLUTION) ×1 IMPLANT
PAD ARMBOARD 7.5X6 YLW CONV (MISCELLANEOUS) ×1 IMPLANT
POUCH LAPAROSCOPIC INSTRUMENT (MISCELLANEOUS) ×1 IMPLANT
POUCH RETRIEVAL ECOSAC 10 (ENDOMECHANICALS) IMPLANT
SCISSORS LAP 5X35 DISP (ENDOMECHANICALS) ×1 IMPLANT
SET TUBE SMOKE EVAC HIGH FLOW (TUBING) ×1 IMPLANT
SLEEVE Z-THREAD 5X100MM (TROCAR) ×1 IMPLANT
SPECIMEN JAR SMALL (MISCELLANEOUS) ×1 IMPLANT
SUT MNCRL AB 4-0 PS2 18 (SUTURE) ×1 IMPLANT
TOWEL GREEN STERILE (TOWEL DISPOSABLE) ×1 IMPLANT
TOWEL GREEN STERILE FF (TOWEL DISPOSABLE) ×1 IMPLANT
TRAY LAPAROSCOPIC MC (CUSTOM PROCEDURE TRAY) ×1 IMPLANT
TROCAR 11X100 Z THREAD (TROCAR) ×1 IMPLANT
TROCAR Z-THREAD OPTICAL 5X100M (TROCAR) ×1 IMPLANT
WARMER LAPAROSCOPE (MISCELLANEOUS) ×1 IMPLANT
WATER STERILE IRR 1000ML POUR (IV SOLUTION) ×1 IMPLANT

## 2022-12-09 NOTE — Anesthesia Procedure Notes (Signed)
Procedure Name: Intubation Date/Time: 12/09/2022 2:12 PM  Performed by: Sheppard Evens, CRNAPre-anesthesia Checklist: Patient identified, Emergency Drugs available, Suction available and Patient being monitored Patient Re-evaluated:Patient Re-evaluated prior to induction Oxygen Delivery Method: Circle System Utilized Preoxygenation: Pre-oxygenation with 100% oxygen Induction Type: IV induction Ventilation: Mask ventilation without difficulty Laryngoscope Size: Mac and 3 Grade View: Grade I Tube type: Oral Tube size: 7.0 mm Number of attempts: 1 Airway Equipment and Method: Stylet and Oral airway Placement Confirmation: ETT inserted through vocal cords under direct vision, positive ETCO2 and breath sounds checked- equal and bilateral Secured at: 21 cm Tube secured with: Tape Dental Injury: Teeth and Oropharynx as per pre-operative assessment

## 2022-12-09 NOTE — Transfer of Care (Signed)
Immediate Anesthesia Transfer of Care Note  Patient: Sarah Levy  Procedure(s) Performed: LAPAROSCOPIC CHOLECYSTECTOMY WITH ICG DYE (Abdomen)  Patient Location: PACU  Anesthesia Type:General  Level of Consciousness: drowsy  Airway & Oxygen Therapy: Patient Spontanous Breathing and Patient connected to nasal cannula oxygen  Post-op Assessment: Report given to RN and Post -op Vital signs reviewed and stable  Post vital signs: Reviewed and stable  Last Vitals:  Vitals Value Taken Time  BP 139/87 12/09/22 1516  Temp    Pulse 100 12/09/22 1520  Resp 22 12/09/22 1520  SpO2 92 % 12/09/22 1520  Vitals shown include unvalidated device data.  Last Pain:  Vitals:   12/09/22 1322  TempSrc:   PainSc: 2          Complications: No notable events documented.

## 2022-12-09 NOTE — Anesthesia Preprocedure Evaluation (Addendum)
Anesthesia Evaluation  Patient identified by MRN, date of birth, ID band Patient awake    Reviewed: Allergy & Precautions, NPO status , Patient's Chart, lab work & pertinent test results  History of Anesthesia Complications Negative for: history of anesthetic complications  Airway Mallampati: III  TM Distance: >3 FB Neck ROM: Full    Dental  (+) Dental Advisory Given   Pulmonary neg shortness of breath, asthma (no recent flares) , neg sleep apnea, neg COPD, neg recent URI, former smoker   Pulmonary exam normal breath sounds clear to auscultation       Cardiovascular hypertension, Pt. on medications and Pt. on home beta blockers (-) angina (-) Past MI, (-) Cardiac Stents and (-) CABG (-) dysrhythmias  Rhythm:Regular Rate:Normal     Neuro/Psych negative neurological ROS  negative psych ROS   GI/Hepatic Neg liver ROS,GERD  Medicated,, Gallstones    Endo/Other  neg diabetesHypothyroidism  Morbid obesity  Renal/GU negative Renal ROS     Musculoskeletal negative musculoskeletal ROS (+)    Abdominal  (+) + obese  Peds  Hematology  (+) Blood dyscrasia, anemia   Anesthesia Other Findings   Reproductive/Obstetrics                             Anesthesia Physical Anesthesia Plan  ASA: 3  Anesthesia Plan: General   Post-op Pain Management:    Induction: Intravenous  PONV Risk Score and Plan: 3 and Treatment may vary due to age or medical condition, Ondansetron, Dexamethasone and Midazolam  Airway Management Planned: Oral ETT  Additional Equipment: None  Intra-op Plan:   Post-operative Plan: Extubation in OR  Informed Consent: I have reviewed the patients History and Physical, chart, labs and discussed the procedure including the risks, benefits and alternatives for the proposed anesthesia with the patient or authorized representative who has indicated his/her understanding and  acceptance.       Plan Discussed with: CRNA and Anesthesiologist  Anesthesia Plan Comments: (Risks of general anesthesia discussed including, but not limited to, sore throat, hoarse voice, chipped/damaged teeth, injury to vocal cords, nausea and vomiting, allergic reactions, lung infection, heart attack, stroke, and death. All questions answered. )       Anesthesia Quick Evaluation

## 2022-12-09 NOTE — Progress Notes (Signed)
Subjective/Chief Complaint: Pt better today Lipase down   Objective: Vital signs in last 24 hours: Temp:  [98 F (36.7 C)-99 F (37.2 C)] 99 F (37.2 C) (06/27 0745) Pulse Rate:  [83-105] 96 (06/27 0745) Resp:  [18] 18 (06/27 0745) BP: (153-170)/(80-97) 166/97 (06/27 0745) SpO2:  [94 %-100 %] 95 % (06/27 0745) Last BM Date : 12/02/22  Intake/Output from previous day: No intake/output data recorded. Intake/Output this shift: No intake/output data recorded.  PE:  Constitutional: No acute distress, conversant, appears states age. Eyes: Anicteric sclerae, moist conjunctiva, no lid lag Lungs: Clear to auscultation bilaterally, normal respiratory effort CV: regular rate and rhythm, no murmurs, no peripheral edema, pedal pulses 2+ GI: Soft, no masses or hepatosplenomegaly, min-tender to palpation Skin: No rashes, palpation reveals normal turgor Psychiatric: appropriate judgment and insight, oriented to person, place, and time   Lab Results:  Recent Labs    12/07/22 1953 12/09/22 0136  WBC 9.0 11.4*  HGB 12.0 11.7*  HCT 37.9 37.9  PLT 426* 313   BMET Recent Labs    12/08/22 0345 12/09/22 0136  NA 140 135  K 3.7 3.7  CL 101 103  CO2 25 24  GLUCOSE 114* 98  BUN 7 <5*  CREATININE 0.97 0.86  CALCIUM 8.9 8.1*   Studies/Results: CT ABDOMEN PELVIS W CONTRAST  Result Date: 12/08/2022 CLINICAL DATA:  Intermittent epigastric pain. EXAM: CT ABDOMEN AND PELVIS WITH CONTRAST TECHNIQUE: Multidetector CT imaging of the abdomen and pelvis was performed using the standard protocol following bolus administration of intravenous contrast. RADIATION DOSE REDUCTION: This exam was performed according to the departmental dose-optimization program which includes automated exposure control, adjustment of the mA and/or kV according to patient size and/or use of iterative reconstruction technique. CONTRAST:  75mL OMNIPAQUE IOHEXOL 350 MG/ML SOLN COMPARISON:  None Available. FINDINGS:  Lower chest: No acute abnormality. Hepatobiliary: There is diffuse fatty infiltration of the liver parenchyma. No focal liver abnormality is seen. No gallstones, gallbladder wall thickening, or biliary dilatation. Pancreas: There is marked severity peripancreatic inflammatory fat stranding and peripancreatic fluid. No pancreatic ductal dilatation is identified. Spleen: Normal in size without focal abnormality. Adrenals/Urinary Tract: Adrenal glands are unremarkable. Kidneys are normal, without renal calculi, focal lesion, or hydronephrosis. Bladder is unremarkable. Stomach/Bowel: Stomach is within normal limits. Appendix appears normal. No evidence of bowel dilatation. Peri duodenal inflammatory fat stranding is seen which appears to originate from the pancreas. Noninflamed diverticula are seen throughout the large bowel. Vascular/Lymphatic: No significant vascular findings are present. No enlarged abdominal or pelvic lymph nodes. Reproductive: Uterus and bilateral adnexa are unremarkable. Other: No abdominal wall hernia or abnormality. No abdominopelvic ascites. Musculoskeletal: No acute or significant osseous findings. IMPRESSION: 1. Marked severity acute pancreatitis. 2. Duodenitis involving the adjacent portion of the proximal duodenum cannot be excluded. 3. Colonic diverticulosis. Electronically Signed   By: Aram Candela M.D.   On: 12/08/2022 01:39   US Abdomen Limited RUQ (LIVER/GB)  Result Date: 12/08/2022 CLINICAL DATA:  Epigastric pain. EXAM: ULTRASOUND ABDOMEN LIMITED RIGHT UPPER QUADRANT COMPARISON:  None Available. FINDINGS: Gallbladder: Multiple small, shadowing echogenic gallstones are seen within the gallbladder fundus. The largest measures approximately 6 mm. The gallbladder wall measures 3.7 mm in thickness. No sonographic Murphy sign noted by sonographer. Common bile duct: Diameter: 2.2 mm Liver: No focal lesion identified. Within normal limits in parenchymal echogenicity. Portal vein is  patent on color Doppler imaging with normal direction of blood flow towards the liver. Other: None. IMPRESSION: Cholelithiasis, without evidence  of acute cholecystitis. Electronically Signed   By: Aram Candela M.D.   On: 12/08/2022 00:57    Anti-infectives: Anti-infectives (From admission, onward)    None       Assessment/Plan: 61F with biliary pancreatitis Cholelithiasis  To OR today for lap chole All risks and benefits were discussed with the patient to generally include: infection, bleeding, possible need for post op ERCP, damage to the bile ducts, and bile leak. Alternatives were offered and described.  All questions were answered and the patient voiced understanding of the procedure and wishes to proceed at this point with a laparoscopic cholecystectomy   LOS: 1 day    Axel Filler 12/09/2022

## 2022-12-09 NOTE — Progress Notes (Addendum)
Subjective:  Sarah Levy is an 37 y.o. F with a PMHx significant for preE/HTN with subsequent C section in 09/2022, hypothyroidism, GERD presenting with weeks of colicky abdominal pain progressing to 12 hours of continuous pain with vomiting admitted for acute pancreatitis in setting of cholelithiasis.  Pt was examined at bedside today. She continues to report improving abdominal pain, and states it is a 2-3/10 while at rest. She does report pain increased to a 7-8/10 last night when she tried to eat dinner, and she reports her pain increases with movement. She does report good pain control with prn medications. She reports a mild headache, but denies N/V, SOB, CP. She denies experiencing symptoms with her elevated BPs. She reports she is urinating often with no burning or pain. She has not yet had a bowel movement.  Of note, she is breastfeeding and is wondering about safe medications with this. She does not currently have a PCP and has been seeing her OB-GYN for care.  Objective:  Vital signs in last 24 hours: Vitals:   12/08/22 1613 12/08/22 1923 12/09/22 0405 12/09/22 0745  BP: (!) 153/80 (!) 155/94 (!) 170/95 (!) 166/97  Pulse: 83 97 (!) 105 96  Resp: 18 18 18 18   Temp: 98.5 F (36.9 C) 98 F (36.7 C) 98.5 F (36.9 C) 99 F (37.2 C)  TempSrc:   Oral Oral  SpO2: 95% 100% 94% 95%  Weight:      Height:       Weight change:  No intake or output data in the 24 hours ending 12/09/22 1047   Physical Exam General: Pt is well appearing and lying down comfortably in bed, no acute distress. Cardiovascular: RRR, no murmurs, rubs, gallops. Pulmonary: Normal work of breathing. Lungs clear to auscultation bilaterally. Abdomen: Normal bowel sounds. Soft and nondistended in all four quadrants. No skin abnormalities. Neuro/Psych: Alert and oriented to person, place, event. (Time not formerly assessed.) Normal affect.     Latest Ref Rng & Units 12/09/2022    1:36 AM 12/08/2022     3:45 AM 12/07/2022    7:53 PM  CMP  Glucose 70 - 99 mg/dL 98  409  811   BUN 6 - 20 mg/dL 5  7  6    Creatinine 0.44 - 1.00 mg/dL 9.14  7.82  9.56   Sodium 135 - 145 mmol/L 135  140  140   Potassium 3.5 - 5.1 mmol/L 3.7  3.7  3.0   Chloride 98 - 111 mmol/L 103  101  107   CO2 22 - 32 mmol/L 24  25  26    Calcium 8.9 - 10.3 mg/dL 8.1  8.9  8.4   Total Protein 6.5 - 8.1 g/dL 6.0  6.3  6.0   Total Bilirubin 0.3 - 1.2 mg/dL 0.8  1.2  2.1   Alkaline Phos 38 - 126 U/L 196  240  226   AST 15 - 41 U/L 77  306  376   ALT 0 - 44 U/L 289  524  478    Lipase     Component Value Date/Time   LIPASE 860 (H) 12/09/2022 0136      Latest Ref Rng & Units 12/09/2022    1:36 AM 12/07/2022    7:53 PM 10/07/2022    5:16 AM  CBC  WBC 4.0 - 10.5 K/uL 11.4  9.0  12.3   Hemoglobin 12.0 - 15.0 g/dL 21.3  08.6  9.9   Hematocrit 36.0 - 46.0 %  37.9  37.9  30.5   Platelets 150 - 400 K/uL 313  426  438     Assessment/Plan:  Principal Problem:   Acute gallstone pancreatitis  Sarah Levy is an 37 y.o. F with a PMHx significant for preE/HTN with subsequent C section in 09/2022, hypothyroidism, GERD presenting with weeks of colicky abdominal pain progressing to 12 hours of continuous pain with vomiting admitted for acute pancreatitis in setting of cholelithiasis.  Acute gallstone pancreatitis Pt continues to report improving pain and deny N/V. Labs have been improving. Gen surg to do laparoscopic cholecystectomy this afternoon. - Continue to advance diet as tolerated; continue po fluids, will likely stop IV fluids after surgery - Pain control with oxycodone 5 mg q4h prn, hydromorphone 0.5 to 1 mg IV q2h prn - Bowel regimen of Senokot S nightly and Miralax daily given opioid therapy - Repeat labs: CMP (follow liver enzymes), CBC (follow blood count) - Will monitor Is/Os - Consider ACR inpatient vs outpatient given proteinuria on urinalysis  Hypertension - Continue holding labetalol until adequate pain  control; consider restarting post-surgery pending pain, BP readings  Hypothyroidism - Levothyroxine 88 mcg daily  GERD - Protonix 40 mg daily  Dispo: Consider discharge this pm or tomorrow am pending surgery. Patient would like to stay the night if possible. Will need to get patient a PCP. Will need to ensure home meds safe for breastfeeding.  Level of care: MedSurg Diet: NPO since midnight in prep for surgery; otherwise full liquid, low-fat IVF: discontinued IV fluids VTE: enoxaparin (LOVENOX) injection 40 mg after surgery Code: Full Surrogate: Wife, Deon Pilling   LOS: 1 day   Governor Rooks, Medical Student 12/09/2022, 10:47 AM Attestation for Student Documentation:  I personally was present and re-performed the history, physical exam and medical decision-making activities of this service and have verified that the service and findings are accurately documented in the student's note.  Morene Crocker, MD 12/09/2022, 12:37 PM

## 2022-12-09 NOTE — Op Note (Addendum)
12/09/2022  3:10 PM  PATIENT:  Sarah Levy  37 y.o. female  PRE-OPERATIVE DIAGNOSIS:  gallstonesm biliary pancreatitis  POST-OPERATIVE DIAGNOSIS:  gallstones, same  PROCEDURE:  Procedure(s): LAPAROSCOPIC CHOLECYSTECTOMY WITH ICG DYE (N/A)  SURGEON:  Surgeon(s) and Role:    * Axel Filler, MD - Primary  ASSISTANTS: Dr. Margarito Courser, PGY-3   ANESTHESIA:   local and general  EBL:  minimal   BLOOD ADMINISTERED:none  DRAINS: none   LOCAL MEDICATIONS USED:  BUPIVICAINE   SPECIMEN:  Source of Specimen:  gallbladder  DISPOSITION OF SPECIMEN:  PATHOLOGY  COUNTS:  YES  TOURNIQUET:  * No tourniquets in log *  DICTATION: .Dragon Dictation The patient was taken to the operating and placed in the supine position with bilateral SCDs in place.  The patient was prepped and draped in the usual sterile fashion. A time out was called and all facts were verified. A pneumoperitoneum was obtained via A Veress needle technique to a pressure of 14mm of mercury.  A 5mm trochar was then placed in the right upper quadrant under visualization, and there were no injuries to any abdominal organs. A 11 mm port was then placed in the umbilical region after infiltrating with local anesthesia under direct visualization. A second and third epigastric port and right lower quadrant port placement under direct visualization, respectively.    The gallbladder was identified and retracted, the peritoneum was then sharply dissected from the gallbladder and this dissection was carried down to Calot's triangle. The cystic duct was identified and stripped away circumferentially and seen going into the gallbladder 360, the critical angle was obtained.  2 clips were placed proximally one distally and the cystic duct transected. The cystic artery was identified and 2 clips placed proximally and one distally and transected. There was an individual posterior and anterior branches that were clipped independently in  this fashion. We then proceeded to remove the gallbladder off the hepatic fossa with Bovie cautery. A retrieval bag was then placed in the abdomen and gallbladder placed in the bag. The hepatic fossa was then reexamined and hemostasis was achieved with Bovie cautery and was excellent at the end of the case.   The subhepatic fossa and perihepatic fossa was then irrigated until the effluent was clear.  The gallbladder and bag were removed from the abdominal cavity. The 11 mm trocar fascia was reapproximated with the Endo Close #1 Vicryl x2.  The pneumoperitoneum was evacuated and all trochars removed under direct visulalization.  The skin was then closed with 4-0 Monocryl and the skin dressed with Dermabond.    The patient was awaken from general anesthesia and taken to the recovery room in stable condition.  I was personally present during the key and critical portions of this procedure and immediately available throughout the entire procedure, as documented in my operative note.   PLAN OF CARE: Admit for overnight observation  PATIENT DISPOSITION:  PACU - hemodynamically stable.   Delay start of Pharmacological VTE agent (>24hrs) due to surgical blood loss or risk of bleeding: not applicable

## 2022-12-09 NOTE — Discharge Instructions (Signed)
[facility] ° °Please arrive at least 30 min before your appointment to complete your check in paperwork.  If you are unable to arrive 30 min prior to your appointment time we may have to cancel or reschedule you. °LAPAROSCOPIC SURGERY: POST OP INSTRUCTIONS °Always review your discharge instruction sheet given to you by the facility where your surgery was performed. °IF YOU HAVE DISABILITY OR FAMILY LEAVE FORMS, YOU MUST BRING THEM TO THE OFFICE FOR PROCESSING.   °DO NOT GIVE THEM TO YOUR DOCTOR. ° °PAIN CONTROL ° °First take acetaminophen (Tylenol) AND/or ibuprofen (Advil) to control your pain after surgery.  Follow directions on package.  Taking acetaminophen (Tylenol) and/or ibuprofen (Advil) regularly after surgery will help to control your pain and lower the amount of prescription pain medication you may need.  You should not take more than 4,000 mg (4 grams) of acetaminophen (Tylenol) in 24 hours.  You should not take ibuprofen (Advil), aleve, motrin, naprosyn or other NSAIDS if you have a history of stomach ulcers or chronic kidney disease.  °A prescription for pain medication may be given to you upon discharge.  Take your pain medication as prescribed, if you still have uncontrolled pain after taking acetaminophen (Tylenol) or ibuprofen (Advil). °Use ice packs to help control pain. °If you need a refill on your pain medication, please contact your pharmacy.  They will contact our office to request authorization. Prescriptions will not be filled after 5pm or on week-ends. ° °HOME MEDICATIONS °Take your usually prescribed medications unless otherwise directed. ° °DIET °You should follow a light diet the first few days after arrival home.  Be sure to include lots of fluids daily. Avoid fatty, fried foods.  ° °CONSTIPATION °It is common to experience some constipation after surgery and if you are taking pain medication.  Increasing fluid intake and taking a stool softener (such as Colace)  will usually help or prevent this problem from occurring.  A mild laxative (Milk of Magnesia or Miralax) should be taken according to package instructions if there are no bowel movements after 48 hours. ° °WOUND/INCISION CARE °Most patients will experience some swelling and bruising in the area of the incisions.  Ice packs will help.  Swelling and bruising can take several days to resolve.  °Unless discharge instructions indicate otherwise, follow guidelines below  °STERI-STRIPS - you may remove your outer bandages 48 hours after surgery, and you may shower at that time.  You have steri-strips (small skin tapes) in place directly over the incision.  These strips should be left on the skin for 7-10 days.   °DERMABOND/SKIN GLUE - you may shower in 24 hours.  The glue will flake off over the next 2-3 weeks. °Any sutures or staples will be removed at the office during your follow-up visit. ° °ACTIVITIES °You may resume regular (light) daily activities beginning the next day--such as daily self-care, walking, climbing stairs--gradually increasing activities as tolerated.  You may have sexual intercourse when it is comfortable.  Refrain from any heavy lifting or straining until approved by your doctor. °You may drive when you are no longer taking prescription pain medication, you can comfortably wear a seatbelt, and you can safely maneuver your car and apply brakes. ° °FOLLOW-UP °You should see your doctor in the office for a follow-up appointment approximately 2-3 weeks after your surgery.  You should have been given your post-op/follow-up appointment when your surgery was scheduled.  If you did not receive a post-op/follow-up appointment, make sure   that you call for this appointment within a day or two after you arrive home to insure a convenient appointment time. ° ° °WHEN TO CALL YOUR DOCTOR: °Fever over 101.0 °Inability to urinate °Continued bleeding from incision. °Increased pain, redness, or drainage from the  incision. °Increasing abdominal pain ° °The clinic staff is available to answer your questions during regular business hours.  Please don’t hesitate to call and ask to speak to one of the nurses for clinical concerns.  If you have a medical emergency, go to the nearest emergency room or call 911.  A surgeon from [facility] is always on call at the hospital. °[address], [address], [place], [place]  [address] ? [address] [address], [place], [place]   [address] °[phone] ? [phone] ? FAX [phone] ° ° ° ° °Managing Your Pain After Surgery Without Opioids ° ° ° °Thank you for participating in our program to help patients manage their pain after surgery without opioids. This is part of our effort to provide you with the best care possible, without exposing you or your family to the risk that opioids pose. ° °What pain can I expect after surgery? °You can expect to have some pain after surgery. This is normal. The pain is typically worse the day after surgery, and quickly begins to get better. °Many studies have found that many patients are able to manage their pain after surgery with Over-the-Counter (OTC) medications such as Tylenol and Motrin. If you have a condition that does not allow you to take Tylenol or Motrin, notify your surgical team. ° °How will I manage my pain? °The best strategy for controlling your pain after surgery is around the clock pain control with Tylenol (acetaminophen) and Motrin (ibuprofen or Advil). Alternating these medications with each other allows you to maximize your pain control. In addition to Tylenol and Motrin, you can use heating pads or ice packs on your incisions to help reduce your pain. ° °How will I alternate your regular strength over-the-counter pain medication? °You will take a dose of pain medication every three hours. °Start by taking 650 mg of Tylenol (2 pills of 325 mg) °3 hours later take 600 mg of Motrin (3 pills of 200 mg) °3 hours after  taking the Motrin take 650 mg of Tylenol °3 hours after that take 600 mg of Motrin. ° ° °- 1 - ° °See example - if your first dose of Tylenol is at 12:00 PM ° ° °12:00 PM Tylenol 650 mg (2 pills of 325 mg)  °3:00 PM Motrin 600 mg (3 pills of 200 mg)  °6:00 PM Tylenol 650 mg (2 pills of 325 mg)  °9:00 PM Motrin 600 mg (3 pills of 200 mg)  °Continue alternating every 3 hours  ° °We recommend that you follow this schedule around-the-clock for at least 3 days after surgery, or until you feel that it is no longer needed. Use the table on the last page of this handout to keep track of the medications you are taking. °Important: °Do not take more than 3000mg of Tylenol or 3200mg of Motrin in a 24-hour period. °Do not take ibuprofen/Motrin if you have a history of bleeding stomach ulcers, severe kidney disease, &/or actively taking a blood thinner ° °What if I still have pain? °If you have pain that is not controlled with the over-the-counter pain medications (Tylenol and Motrin or Advil) you might have what we call “breakthrough” pain. You will receive a prescription   for a small amount of an opioid pain medication such as Oxycodone, Tramadol, or Tylenol with Codeine. Use these opioid pills in the first 24 hours after surgery if you have breakthrough pain. Do not take more than 1 pill every 4-6 hours. ° °If you still have uncontrolled pain after using all opioid pills, don't hesitate to call our staff using the number provided. We will help make sure you are managing your pain in the best way possible, and if necessary, we can provide a prescription for additional pain medication. ° ° °Day 1   ° °Time  °Name of Medication Number of pills taken  °Amount of Acetaminophen  °Pain Level  ° °Comments  °AM PM       °AM PM       °AM PM       °AM PM       °AM PM       °AM PM       °AM PM       °AM PM       °Total Daily amount of Acetaminophen °Do not take more than  3,000 mg per day    ° ° °Day 2   ° °Time  °Name of Medication  Number of pills °taken  °Amount of Acetaminophen  °Pain Level  ° °Comments  °AM PM       °AM PM       °AM PM       °AM PM       °AM PM       °AM PM       °AM PM       °AM PM       °Total Daily amount of Acetaminophen °Do not take more than  3,000 mg per day    ° ° °Day 3   ° °Time  °Name of Medication Number of pills taken  °Amount of Acetaminophen  °Pain Level  ° °Comments  °AM PM       °AM PM       °AM PM       °AM PM       ° ° ° °AM PM       °AM PM       °AM PM       °AM PM       °Total Daily amount of Acetaminophen °Do not take more than  3,000 mg per day    ° ° °Day 4   ° °Time  °Name of Medication Number of pills taken  °Amount of Acetaminophen  °Pain Level  ° °Comments  °AM PM       °AM PM       °AM PM       °AM PM       °AM PM       °AM PM       °AM PM       °AM PM       °Total Daily amount of Acetaminophen °Do not take more than  3,000 mg per day    ° ° °Day 5   ° °Time  °Name of Medication Number °of pills taken  °Amount of Acetaminophen  °Pain Level  ° °Comments  °AM PM       °AM PM       °AM PM       °AM PM       °AM PM       °AM   PM       °AM PM       °AM PM       °Total Daily amount of Acetaminophen °Do not take more than  3,000 mg per day    ° ° ° °Day 6   ° °Time  °Name of Medication Number of pills °taken  °Amount of Acetaminophen  °Pain Level  °Comments  °AM PM       °AM PM       °AM PM       °AM PM       °AM PM       °AM PM       °AM PM       °AM PM       °Total Daily amount of Acetaminophen °Do not take more than  3,000 mg per day    ° ° °Day 7   ° °Time  °Name of Medication Number of pills taken  °Amount of Acetaminophen  °Pain Level  ° °Comments  °AM PM       °AM PM       °AM PM       °AM PM       °AM PM       °AM PM       °AM PM       °AM PM       °Total Daily amount of Acetaminophen °Do not take more than  3,000 mg per day    ° ° ° ° °For additional information about how and where to safely dispose of unused opioid °medications - https://www.morepowerfulnc.org ° °Disclaimer: This document  contains information and/or instructional materials adapted from [facility] for the typical patient with your condition. It does not replace medical advice from your health care provider because your experience may differ from that of the °typical patient. Talk to your health care provider if you have any questions about this °document, your condition or your treatment plan. °Adapted from [facility] ° °

## 2022-12-09 NOTE — Plan of Care (Signed)

## 2022-12-10 ENCOUNTER — Encounter (HOSPITAL_COMMUNITY): Payer: Self-pay | Admitting: General Surgery

## 2022-12-10 LAB — COMPREHENSIVE METABOLIC PANEL
ALT: 175 U/L — ABNORMAL HIGH (ref 0–44)
AST: 34 U/L (ref 15–41)
Albumin: 3.1 g/dL — ABNORMAL LOW (ref 3.5–5.0)
Alkaline Phosphatase: 160 U/L — ABNORMAL HIGH (ref 38–126)
Anion gap: 13 (ref 5–15)
BUN: 7 mg/dL (ref 6–20)
CO2: 26 mmol/L (ref 22–32)
Calcium: 8.9 mg/dL (ref 8.9–10.3)
Chloride: 99 mmol/L (ref 98–111)
Creatinine, Ser: 0.79 mg/dL (ref 0.44–1.00)
GFR, Estimated: >60 mL/min (ref >60)
Glucose, Bld: 140 mg/dL — ABNORMAL HIGH (ref 70–99)
Potassium: 3.5 mmol/L (ref 3.5–5.1)
Sodium: 138 mmol/L (ref 135–145)
Total Bilirubin: 0.7 mg/dL (ref 0.3–1.2)
Total Protein: 6.3 g/dL — ABNORMAL LOW (ref 6.5–8.1)

## 2022-12-10 LAB — CBC
HCT: 33.9 % — ABNORMAL LOW (ref 36.0–46.0)
Hemoglobin: 10.9 g/dL — ABNORMAL LOW (ref 12.0–15.0)
MCH: 27 pg (ref 26.0–34.0)
MCHC: 32.2 g/dL (ref 30.0–36.0)
MCV: 83.9 fL (ref 80.0–100.0)
Platelets: 400 10*3/uL (ref 150–400)
RBC: 4.04 MIL/uL (ref 3.87–5.11)
RDW: 13.5 % (ref 11.5–15.5)
WBC: 12.3 10*3/uL — ABNORMAL HIGH (ref 4.0–10.5)
nRBC: 0 % (ref 0.0–0.2)

## 2022-12-10 MED ORDER — ENOXAPARIN SODIUM 40 MG/0.4ML IJ SOSY
40.0000 mg | PREFILLED_SYRINGE | INTRAMUSCULAR | Status: DC
  Administered 2022-12-10: 40 mg via SUBCUTANEOUS
  Filled 2022-12-10: qty 0.4

## 2022-12-10 MED ORDER — ONDANSETRON HCL 4 MG PO TABS: 4.0000 mg | ORAL_TABLET | Freq: Three times a day (TID) | ORAL | 0 refills | Status: AC | PRN

## 2022-12-10 MED ORDER — OXYCODONE HCL 5 MG PO TABS: 5.0000 mg | ORAL_TABLET | Freq: Four times a day (QID) | ORAL | 0 refills | Status: AC | PRN

## 2022-12-10 MED ORDER — HYDROXYZINE HCL 25 MG PO TABS
25.0000 mg | ORAL_TABLET | Freq: Once | ORAL | Status: AC
  Administered 2022-12-10: 25 mg via ORAL
  Filled 2022-12-10: qty 1

## 2022-12-10 MED ORDER — LABETALOL HCL 200 MG PO TABS
800.0000 mg | ORAL_TABLET | Freq: Three times a day (TID) | ORAL | Status: DC
  Administered 2022-12-10: 800 mg via ORAL
  Filled 2022-12-10: qty 4

## 2022-12-10 MED ORDER — POLYETHYLENE GLYCOL 3350 17 G PO PACK: 17.0000 g | PACK | Freq: Every day | ORAL | 0 refills | Status: AC

## 2022-12-10 MED ORDER — SENNOSIDES-DOCUSATE SODIUM 8.6-50 MG PO TABS: 1.0000 | ORAL_TABLET | Freq: Every day | ORAL | 0 refills | Status: AC

## 2022-12-10 NOTE — Progress Notes (Signed)
   Progress Note  1 Day Post-Op  Subjective: Pt reports soreness but overall doing well. Tolerating diet. Passing flatus, no BM yet. Ambulating.   Objective: Vital signs in last 24 hours: Temp:  [98 F (36.7 C)-98.9 F (37.2 C)] 98.9 F (37.2 C) (06/28 0747) Pulse Rate:  [85-101] 93 (06/28 0747) Resp:  [16-24] 16 (06/28 0747) BP: (139-170)/(82-98) 150/86 (06/28 0747) SpO2:  [83 %-97 %] 97 % (06/28 0747) Weight:  [99.8 kg] 99.8 kg (06/27 1307) Last BM Date : 12/07/22  Intake/Output from previous day: 06/27 0701 - 06/28 0700 In: 1050 [I.V.:1000; IV Piggyback:50] Out: 5 [Blood:5] Intake/Output this shift: No intake/output data recorded.  PE: General: pleasant, WD, WN female who is laying in bed in NAD HEENT: sclera anicteric  Heart: regular, rate, and rhythm.   Lungs:  Respiratory effort nonlabored Abd: soft, appropriately ttp, ND, +BS, incisions C/D/I Psych: A&Ox3 with an appropriate affect.    Lab Results:  Recent Labs    12/07/22 1953 12/09/22 0136  WBC 9.0 11.4*  HGB 12.0 11.7*  HCT 37.9 37.9  PLT 426* 313   BMET Recent Labs    12/08/22 0345 12/09/22 0136  NA 140 135  K 3.7 3.7  CL 101 103  CO2 25 24  GLUCOSE 114* 98  BUN 7 <5*  CREATININE 0.97 0.86  CALCIUM 8.9 8.1*   PT/INR No results for input(s): "LABPROT", "INR" in the last 72 hours. CMP     Component Value Date/Time   NA 135 12/09/2022 0136   K 3.7 12/09/2022 0136   CL 103 12/09/2022 0136   CO2 24 12/09/2022 0136   GLUCOSE 98 12/09/2022 0136   BUN <5 (L) 12/09/2022 0136   CREATININE 0.86 12/09/2022 0136   CALCIUM 8.1 (L) 12/09/2022 0136   PROT 6.0 (L) 12/09/2022 0136   ALBUMIN 3.3 (L) 12/09/2022 0136   AST 77 (H) 12/09/2022 0136   ALT 289 (H) 12/09/2022 0136   ALKPHOS 196 (H) 12/09/2022 0136   BILITOT 0.8 12/09/2022 0136   GFRNONAA >60 12/09/2022 0136   Lipase     Component Value Date/Time   LIPASE 860 (H) 12/09/2022 0136       Studies/Results: No results  found.  Anti-infectives: Anti-infectives (From admission, onward)    None        Assessment/Plan  Biliary pancreatitis  POD1 S/p laparoscopic cholecystectomy with ICG dye - tolerating diet - sore, as expected - passing some flatus - mobilizing - stable for DC from surgery standpoint, Rx sent for pain meds and letter provided for work clearance. Follow up and instructions in AVS    LOS: 2 days    Juliet Rude, [facility] 12/10/2022, 8:03 AM Please see Amion for pager number during day hours 7:00am-4:30pm

## 2022-12-10 NOTE — Discharge Summary (Signed)
Name: Sarah Levy MRN: [id number] DOB: [birth year] 37 y.o. PCP: Olivia Mackie, MD  Date of Admission: 12/07/2022  7:42 PM Date of Discharge: 12/10/2022 11:36 AM Attending Physician: Dr. Cleda Daub  Discharge Diagnosis: Principal Problem:   Acute gallstone pancreatitis    Discharge Medications: Allergies as of 12/10/2022       Reactions   Sulfa Antibiotics    Feels like throat is closing   Tylenol [acetaminophen]    Itchy throat, feels like it is closing        Medication List     STOP taking these medications    magnesium oxide 400 (240 Mg) MG tablet Commonly known as: MAG-OX   NIFEdipine 60 MG 24 hr tablet Commonly known as: ADALAT CC       TAKE these medications    albuterol 108 (90 Base) MCG/ACT inhaler Commonly known as: VENTOLIN HFA Inhale 1-2 puffs into the lungs every 6 (six) hours as needed for wheezing or shortness of breath.   busPIRone 10 MG tablet Commonly known as: BUSPAR Take 1 tablet (10 mg total) by mouth 2 (two) times daily as needed (anxiety).   cetirizine 5 MG tablet Commonly known as: ZYRTEC Take 10 mg by mouth daily.   cholecalciferol 25 MCG (1000 UNIT) tablet Commonly known as: VITAMIN D3 Take 1,000 Units by mouth daily.   ibuprofen 600 MG tablet Commonly known as: ADVIL Take 1 tablet (600 mg total) by mouth every 6 (six) hours.   labetalol 200 MG tablet Commonly known as: NORMODYNE Take 4 tablets (800 mg total) by mouth every 8 (eight) hours.   levothyroxine 88 MCG tablet Commonly known as: SYNTHROID Take 88 mcg by mouth daily before breakfast.   ondansetron 4 MG tablet Commonly known as: ZOFRAN Take 1 tablet (4 mg total) by mouth every 8 (eight) hours as needed for up to 2 days for nausea or vomiting. What changed:  how much to take when to take this reasons to take this   oxyCODONE 5 MG immediate release tablet Commonly known as: Oxy IR/ROXICODONE Take 1 tablet (5 mg total) by mouth every 6 (six) hours as  needed for moderate pain or severe pain. What changed:  when to take this reasons to take this   pantoprazole 40 MG tablet Commonly known as: PROTONIX Take 40 mg by mouth daily.   polyethylene glycol 17 g packet Commonly known as: MIRALAX / GLYCOLAX Take 17 g by mouth daily. Start taking on: December 11, 2022   prenatal multivitamin Tabs tablet Take 1 tablet by mouth daily at 12 noon.   senna-docusate 8.6-50 MG tablet Commonly known as: Senokot-S Take 1 tablet by mouth at bedtime.   sertraline 25 MG tablet Commonly known as: ZOLOFT Take 1 tablet (25 mg total) by mouth daily.        Disposition and follow-up:   [name] was discharged from [facility] in Stable condition.  At the hospital follow up visit please address:  1.  Follow-up:  *Gallstone pancreatitis *s/p cholecystectomy on 12/09/2022 - pain control - monitor for pancreatitis complications - Ensure follow up with surgery  *Hypertension *Proteinuria *Pre-eclampsia 09/2022 s/p C-section - Adjust blood pressure medications -Consider transitioning off labetalol to other antihypertensives safe for lactating mother   2.  Labs / imaging needed at time of follow-up: CMP, CBC, urine microalbumin creatinine ratio,   3.  Pending labs/ test needing follow-up: surgical pathology   Follow-up Appointments:  Follow-up Information  Maczis, [place], PA-C Follow up on 12/28/2022.   Specialty: General Surgery Why: 10:15 am, Arrive 30 minutes prior to your appointment time, Please bring your insurance card and photo ID Contact information: [address] [place] [address] [facility] [place] [address] [phone]                12/15/2022 at 3pm with Dr. Katheran James - PCP appointment   Hospital Course by problem list: Sarah Levy is a 37 y.o. with PMHx significant for preE/HTN with C section 09/2022, hypothyroid, GERD presenting with weeks of  colicky abdominal pain which progressed to constant abdominal pain for ~12 hours prior to presentation with vomiting who was admitted for acute pancreatitis in the setting of cholelithiasis s/p laparoscopic cholecystectomy on 6/27 now stable for discharge  Gallstone pancreatitis without complication Patient admitted for acute sharp epigastric pain with radiation to the back. Found to have lipase >4000, cholelithiasis without cholecystitis or choledocholithiasis on abdominal US. CT abdomen with peripancreatic inflammatory fat stranding consistent with pancreatitis. No pseudocyst formation or pancreatic ductal dilatation seen. Patient recovered with IVF resuscitation and pain management, tolerating PO intake on HD1 without nausea. General surgery was consulted on day of admission; she underwent uncomplicated laparoscopic cholecystectomy on 12/09/22. Patient's LFTs continue to downtrend at time of discharge. She is being discharged with a short course of opiates and antiemetics. Precautionary symptoms that warrant medical evaluation were reviewed. Follow up CMP at follow up.  HTN Proteinuria Pre-eclampsia with c-section in 09/2022 Normal renal function with proteinuria on U/A.  Will need urine microalbumin ratio in outpatient setting. Likely still recovering from pre-eclampsia. Restarted on labetalol at 800 mg TID. She wil be following up at [facility] clinic where she wishes to establish for PCP needs.  GERD Continued on Protonix  Hypothyroid Continued on levothyroxine  Discharge Subjective: Patient feeling much better this AM, tolerating PO intake, including solids, passing gas and ambulating  Discharge Exam:   Blood pressure (!) 150/86, pulse 93, temperature 98.9 F (37.2 C), temperature source Oral, resp. rate 16, height 5\' 4"  (1.626 m), weight 99.8 kg, last menstrual period 11/04/2022, SpO2 97 %, currently breastfeeding.  Constitutional:well-appearing woman sitting in bed, in no acute distress HENT:  normocephalic atraumatic, mucous membranes moist Cardiovascular: regular rate and rhythm, no m/r/g, Pulmonary/Chest: normal work of breathing on room air, lungs clear to auscultation bilaterally. No crackles Abdominal: soft, non-tender, non-distended. 4 lap chole incision wounds without surrounding erythema. BS present Neurological: alert & oriented x 3 MSK: no gross abnormalities. No pitting edema Skin: warm and dry Psych: Normal mood and affect  Pertinent Labs, Studies, and Procedures:     Latest Ref Rng & Units 12/10/2022    7:35 AM 12/09/2022    1:36 AM 12/07/2022    7:53 PM  CBC  WBC 4.0 - 10.5 K/uL 12.3  11.4  9.0   Hemoglobin 12.0 - 15.0 g/dL 13.0  86.5  78.4   Hematocrit 36.0 - 46.0 % 33.9  37.9  37.9   Platelets 150 - 400 K/uL 400  313  426        Latest Ref Rng & Units 12/10/2022    7:35 AM 12/09/2022    1:36 AM 12/08/2022    3:45 AM  CMP  Glucose 70 - 99 mg/dL 696  98  295   BUN 6 - 20 mg/dL 7  <5  7   Creatinine 2.84 - 1.00 mg/dL 1.32  4.40  1.02   Sodium 135 -  145 mmol/L 138  135  140   Potassium 3.5 - 5.1 mmol/L 3.5  3.7  3.7   Chloride 98 - 111 mmol/L 99  103  101   CO2 22 - 32 mmol/L 26  24  25    Calcium 8.9 - 10.3 mg/dL 8.9  8.1  8.9   Total Protein 6.5 - 8.1 g/dL 6.3  6.0  6.3   Total Bilirubin 0.3 - 1.2 mg/dL 0.7  0.8  1.2   Alkaline Phos 38 - 126 U/L 160  196  240   AST 15 - 41 U/L 34  77  306   ALT 0 - 44 U/L 175  289  524     CT ABDOMEN PELVIS W CONTRAST  Result Date: 12/08/2022 CLINICAL DATA:  Intermittent epigastric pain. EXAM: CT ABDOMEN AND PELVIS WITH CONTRAST TECHNIQUE: Multidetector CT imaging of the abdomen and pelvis was performed using the standard protocol following bolus administration of intravenous contrast. RADIATION DOSE REDUCTION: This exam was performed according to the departmental dose-optimization program which includes automated exposure control, adjustment of the mA and/or kV according to patient size and/or use of iterative  reconstruction technique. CONTRAST:  75mL OMNIPAQUE IOHEXOL 350 MG/ML SOLN COMPARISON:  None Available. FINDINGS: Lower chest: No acute abnormality. Hepatobiliary: There is diffuse fatty infiltration of the liver parenchyma. No focal liver abnormality is seen. No gallstones, gallbladder wall thickening, or biliary dilatation. Pancreas: There is marked severity peripancreatic inflammatory fat stranding and peripancreatic fluid. No pancreatic ductal dilatation is identified. Spleen: Normal in size without focal abnormality. Adrenals/Urinary Tract: Adrenal glands are unremarkable. Kidneys are normal, without renal calculi, focal lesion, or hydronephrosis. Bladder is unremarkable. Stomach/Bowel: Stomach is within normal limits. Appendix appears normal. No evidence of bowel dilatation. Peri duodenal inflammatory fat stranding is seen which appears to originate from the pancreas. Noninflamed diverticula are seen throughout the large bowel. Vascular/Lymphatic: No significant vascular findings are present. No enlarged abdominal or pelvic lymph nodes. Reproductive: Uterus and bilateral adnexa are unremarkable. Other: No abdominal wall hernia or abnormality. No abdominopelvic ascites. Musculoskeletal: No acute or significant osseous findings. IMPRESSION: 1. Marked severity acute pancreatitis. 2. Duodenitis involving the adjacent portion of the proximal duodenum cannot be excluded. 3. Colonic diverticulosis. Electronically Signed   By: Aram Candela M.D.   On: 12/08/2022 01:39   US Abdomen Limited RUQ (LIVER/GB)  Result Date: 12/08/2022 CLINICAL DATA:  Epigastric pain. EXAM: ULTRASOUND ABDOMEN LIMITED RIGHT UPPER QUADRANT COMPARISON:  None Available. FINDINGS: Gallbladder: Multiple small, shadowing echogenic gallstones are seen within the gallbladder fundus. The largest measures approximately 6 mm. The gallbladder wall measures 3.7 mm in thickness. No sonographic Murphy sign noted by sonographer. Common bile duct:  Diameter: 2.2 mm Liver: No focal lesion identified. Within normal limits in parenchymal echogenicity. Portal vein is patent on color Doppler imaging with normal direction of blood flow towards the liver. Other: None. IMPRESSION: Cholelithiasis, without evidence of acute cholecystitis. Electronically Signed   By: Aram Candela M.D.   On: 12/08/2022 00:57     Discharge Instructions: Discharge Instructions     Call MD for:  difficulty breathing, headache or visual disturbances   Complete by: As directed    Call MD for:  extreme fatigue   Complete by: As directed    Call MD for:  hives   Complete by: As directed    Call MD for:  persistant dizziness or light-headedness   Complete by: As directed    Call MD for:  persistant nausea and vomiting  Complete by: As directed    Call MD for:  redness, tenderness, or signs of infection (pain, swelling, redness, odor or green/yellow discharge around incision site)   Complete by: As directed    Call MD for:  severe uncontrolled pain   Complete by: As directed    Call MD for:  temperature >100.4   Complete by: As directed    Discharge instructions   Complete by: As directed    Sarah Levy,  It was a pleasure caring for you during your hospitalization! You came to the hospital with significant abdominal pain which was found to be due to acute pancreatitis from gallstones. Because of this, your gallbladder was surgically removed on 12/09/22 to prevent another episode of acute pancreatitis due to these gallstones.  After your discharge from the hospital, there are a few things for you to do: 1. Continue using oxycodone pain medication as needed; we have provided you with a 5 day course of this. 2. We are restarting you on labetalol at 800 mg TID to improve your high blood pressures.  3. We have arranged a primary care appointment for you at the Internal Medicine outpatient clinic at [facility], located in the hospital building, for 12/15/2022 at 3pm  with Dr. Katheran James. 4. Get a urine microalbumin with your primary care team. 5. Follow-up with the surgery team; you have an appointment on 12/28/2022 at 10:45am. 6.         For nausea, we have prescribed a short course of zofran that you can take as needed.  We are glad you are feeling better, and hope you get to spend lots of good time with your family and new daughter! The medications we are sending you home on are safe to use while breastfeeding. Please come back to the hospital if you begin to experience significant abdominal pain again, start having fevers, changes in the color of your skin, or changes around the surgical site.  Please call our office at [phone] if you have any questions!  Thank you for letting us care for you! Governor Rooks, Medical Student Dr. Daiva Eves, Internal Medicine Resident at [facility]   Increase activity slowly   Complete by: As directed        Signed: Morene Crocker, MD [facility] - PGY1 Pager: [phone] 12/10/2022, 11:36 AM    Please contact the on call pager after 5 pm and on weekends at [phone].

## 2022-12-13 LAB — SURGICAL PATHOLOGY

## 2022-12-14 NOTE — Anesthesia Postprocedure Evaluation (Signed)
Anesthesia Post Note  Patient: Sarah Levy  Procedure(s) Performed: LAPAROSCOPIC CHOLECYSTECTOMY WITH ICG DYE (Abdomen)     Patient location during evaluation: PACU Anesthesia Type: General Level of consciousness: awake and alert Pain management: pain level controlled Vital Signs Assessment: post-procedure vital signs reviewed and stable Respiratory status: spontaneous breathing, nonlabored ventilation, respiratory function stable and patient connected to nasal cannula oxygen Cardiovascular status: blood pressure returned to baseline and stable Postop Assessment: no apparent nausea or vomiting Anesthetic complications: no   No notable events documented.  Last Vitals:  Vitals:   12/10/22 0428 12/10/22 0747  BP: (!) 152/86 (!) 150/86  Pulse: 85 93  Resp:  16  Temp: 36.7 C 37.2 C  SpO2: 92% 97%    Last Pain:  Vitals:   12/10/22 1153  TempSrc:   PainSc: 2                  Kalen Ratajczak

## 2022-12-15 ENCOUNTER — Encounter: Payer: Self-pay | Admitting: Student

## 2022-12-15 ENCOUNTER — Ambulatory Visit (INDEPENDENT_AMBULATORY_CARE_PROVIDER_SITE_OTHER): Payer: BC Managed Care – PPO | Admitting: Student

## 2022-12-15 VITALS — BP 135/87 | HR 68 | Temp 98.1°F | Ht 64.0 in | Wt 198.0 lb

## 2022-12-15 DIAGNOSIS — Z8759 Personal history of other complications of pregnancy, childbirth and the puerperium: Secondary | ICD-10-CM

## 2022-12-15 DIAGNOSIS — E039 Hypothyroidism, unspecified: Secondary | ICD-10-CM

## 2022-12-15 DIAGNOSIS — Z87891 Personal history of nicotine dependence: Secondary | ICD-10-CM

## 2022-12-15 DIAGNOSIS — I10 Essential (primary) hypertension: Secondary | ICD-10-CM | POA: Diagnosis not present

## 2022-12-15 DIAGNOSIS — Z9049 Acquired absence of other specified parts of digestive tract: Secondary | ICD-10-CM | POA: Diagnosis not present

## 2022-12-15 DIAGNOSIS — Z0000 Encounter for general adult medical examination without abnormal findings: Secondary | ICD-10-CM | POA: Diagnosis not present

## 2022-12-15 DIAGNOSIS — Z8719 Personal history of other diseases of the digestive system: Secondary | ICD-10-CM | POA: Diagnosis not present

## 2022-12-15 DIAGNOSIS — O1490 Unspecified pre-eclampsia, unspecified trimester: Secondary | ICD-10-CM | POA: Diagnosis not present

## 2022-12-15 NOTE — Progress Notes (Signed)
CC: Hospital Follow-Up Gallstone Pancreatitis  HPI:  Sarah.Sarah Levy is a 37 y.o. female living with a history stated below and presents today for hospital follow-up of acute gallstone pancreatitis. Overall she believes she is recovering well and is only experiencing pain, rated at 4-6 out of 10. She would also like ot establish care in this clinic. Please see problem based assessment and plan for additional details.  Past Medical History:  Diagnosis Date   Acid reflux    Allergies    Asthma    sports or allergies induced   Back pain    Hypertension    Hypothyroidism    Joint pain    Pre-eclampsia    SOB (shortness of breath)     Current Outpatient Medications on File Prior to Visit  Medication Sig Dispense Refill   albuterol (VENTOLIN HFA) 108 (90 Base) MCG/ACT inhaler Inhale 1-2 puffs into the lungs every 6 (six) hours as needed for wheezing or shortness of breath.     busPIRone (BUSPAR) 10 MG tablet Take 1 tablet (10 mg total) by mouth 2 (two) times daily as needed (anxiety). (Patient not taking: Reported on 12/10/2022) 60 tablet 0   cetirizine (ZYRTEC) 5 MG tablet Take 10 mg by mouth daily.     cholecalciferol (VITAMIN D3) 25 MCG (1000 UNIT) tablet Take 1,000 Units by mouth daily.     ibuprofen (ADVIL) 600 MG tablet Take 1 tablet (600 mg total) by mouth every 6 (six) hours. 30 tablet 0   labetalol (NORMODYNE) 200 MG tablet Take 4 tablets (800 mg total) by mouth every 8 (eight) hours. 90 tablet 0   levothyroxine (SYNTHROID) 88 MCG tablet Take 88 mcg by mouth daily before breakfast.     oxyCODONE (OXY IR/ROXICODONE) 5 MG immediate release tablet Take 1 tablet (5 mg total) by mouth every 6 (six) hours as needed for moderate pain or severe pain. 15 tablet 0   pantoprazole (PROTONIX) 40 MG tablet Take 40 mg by mouth daily.     polyethylene glycol (MIRALAX / GLYCOLAX) 17 g packet Take 17 g by mouth daily. 14 each 0   Prenatal Vit-Fe Fumarate-FA (PRENATAL MULTIVITAMIN) TABS  tablet Take 1 tablet by mouth daily at 12 noon.     senna-docusate (SENOKOT-S) 8.6-50 MG tablet Take 1 tablet by mouth at bedtime. 30 tablet 0   sertraline (ZOLOFT) 25 MG tablet Take 1 tablet (25 mg total) by mouth daily. (Patient not taking: Reported on 12/10/2022) 39 tablet 0   No current facility-administered medications on file prior to visit.    Family History  Problem Relation Age of Onset   High Cholesterol Mother    Diabetes Father    High blood pressure Father    High Cholesterol Father    Heart disease Father    Sudden death Father     Social History   Socioeconomic History   Marital status: Married    Spouse name: Sarah Levy   Number of children: Not on file   Years of education: Not on file   Highest education level: Not on file  Occupational History   Occupation: Designer, multimedia Admin - [employer]  Tobacco Use   Smoking status: Former    Types: Cigarettes    Quit date: 2008    Years since quitting: 16.5   Smokeless tobacco: Never  Vaping Use   Vaping Use: Never used  Substance and Sexual Activity   Alcohol use: Not Currently   Drug use: Never   Sexual  activity: Yes  Other Topics Concern   Not on file  Social History Narrative   Not on file   Social Determinants of Health   Financial Resource Strain: Not on file  Food Insecurity: No Food Insecurity (12/08/2022)   Hunger Vital Sign    Worried About Running Out of Food in the Last Year: Never true    Ran Out of Food in the Last Year: Never true  Transportation Needs: No Transportation Needs (12/08/2022)   PRAPARE - Administrator, Civil Service (Medical): No    Lack of Transportation (Non-Medical): No  Physical Activity: Not on file  Stress: Not on file  Social Connections: Not on file  Intimate Partner Violence: Not At Risk (12/08/2022)   Humiliation, Afraid, Rape, and Kick questionnaire    Fear of Current or Ex-Partner: No    Emotionally Abused: No    Physically Abused: No     Sexually Abused: No    Review of Systems: ROS negative except for what is noted on the assessment and plan.  Vitals:   12/15/22 1522  BP: 135/87  Pulse: 68  Temp: 98.1 F (36.7 C)  TempSrc: Oral  SpO2: 100%  Weight: 198 lb (89.8 kg)  Height: 5\' 4"  (1.626 m)    Physical Exam: Constitutional: well-appearing female sitting in chair, in no acute distress HENT: normocephalic atraumatic, mucous membranes moist Eyes: conjunctiva non-erythematous Cardiovascular: regular rate and rhythm, no m/r/g Pulmonary/Chest: normal work of breathing on room air, lungs clear to auscultation bilaterally Abdominal: soft, non-tender, non-distended. Four well-healing laparoscopic incisions s/p cholecystectomy without erythema or drainage. MSK: normal bulk and tone Neurological: alert & oriented x 3, no focal deficit Skin: warm and dry Psych: normal mood and behavior  Assessment & Plan:   Patient seen with Sarah Levy  Preeclampsia Complicated her first pregnancy in 2023-2024, which resulted in the early birth of her daughter Sarah Levy via C/S. All doing well at this time. Will get ACR today.  Hypothyroid On replacement therapy with recent TSH WNL. Dose was adjusted during her pregnancy and she does state she still experiences fatigue. Plan to keep current dose but monitor and adjust as she recovers from her pregnancy and pancreatitis.  Healthcare maintenance Lipids elevated and Vit D low in most recent labs, 2022. Recheck today and will initiate lifestyle changes/ medicines as needed.  History of acute pancreatitis Hospitalized late June 2024. Pt is recovering well. She does have continued pain at surgical sites and at the back, but it is managed with ibuprofen. Discussed precautions for sudden increase in pain or degeneration of surgical site.  S/P cholecystectomy Surgical sites are healing well, intact, without infection. She has an upcoming appointment with her  surgeon.  Hypertension 135/87 today, on labetalol given recent pregnancy. Will transition to amlodipine for now, might be able to stop this med entirely going forward.  Sarah Levy would like to establish care in this clinic. Did get brief history today and labs, per above. RTC in 3 months for formal establishment of care.  Katheran James, D.O. [facility] Internal Medicine, PGY-1 Phone: [phone] Date 12/17/2022 Time 8:02 AM

## 2022-12-15 NOTE — Assessment & Plan Note (Signed)
Surgical sites are healing well, intact, without infection. She has an upcoming appointment with her surgeon.

## 2022-12-15 NOTE — Assessment & Plan Note (Signed)
Lipids elevated and Vit D low in most recent labs, 2022. Recheck today and will initiate lifestyle changes/ medicines as needed.

## 2022-12-15 NOTE — Assessment & Plan Note (Addendum)
Complicated her first pregnancy in 2023-2024, which resulted in the early birth of her daughter Sarah Levy via C/S. All doing well at this time. Will get ACR today.

## 2022-12-15 NOTE — Assessment & Plan Note (Addendum)
Hospitalized late June 2024. Pt is recovering well. She does have continued pain at surgical sites and at the back, but it is managed with ibuprofen. Discussed precautions for sudden increase in pain or degeneration of surgical site.

## 2022-12-15 NOTE — Patient Instructions (Signed)
Sarah Levy,  Thank you for coming in today! Your surgical site appears to be healing well. Please reach out if your pain increases suddenly or you have other concerns. We will draw some labs today to ensure you are healing well as well as to set you up for general care going forward. We will also reach out to your OBGYN about changing your blood pressure medicine. We will reach out when we have a plan.  We look forward to taking care of you.  Best, Dr. Katheran James

## 2022-12-15 NOTE — Progress Notes (Deleted)
This is a Psychologist, occupational Note.  The care of the patient was discussed with Dr. Marland Kitchen and the assessment and plan was formulated with their assistance.  Please see their note for official documentation of the patient encounter.   Subjective:   Patient ID: Sarah Levy female   DOB: 1985/10/26 37 y.o.   MRN: [id number]  HPI: Sarah Levy is a 37 y.o.   Pain Scar Follow up w surgery Meds rec  Pain continues post surgery around incision cite above umbilicius at at umbilicus. Sent picture of healing site to surgeon monday and was told taht it is healing normally. Today woke up with dried blood leaked around umbilicus andis concerned if it ishealing properly. Continues to have back pain in same area of pancreatitis. Ranks pain 4-5/10 today, which is imrpvoed compared to how it was day of admission 10/10. Does not want to take oxycodone and takes ibuprofen istead for pain management 800 mg every 6 hours.;    Zyrtec, prenatal, pantoprazole, levothyroxine, labetalol, vitamin d  Worse w positional change. Back pain is a different pain, and is constant.   For the details of today's visit, please refer to the assessment and plan.     Past Medical History:  Diagnosis Date   Acid reflux    Allergies    Asthma    sports or allergies induced   Back pain    Hypertension    Hypothyroidism    Joint pain    Pre-eclampsia    SOB (shortness of breath)    Current Outpatient Medications  Medication Sig Dispense Refill   albuterol (VENTOLIN HFA) 108 (90 Base) MCG/ACT inhaler Inhale 1-2 puffs into the lungs every 6 (six) hours as needed for wheezing or shortness of breath.     busPIRone (BUSPAR) 10 MG tablet Take 1 tablet (10 mg total) by mouth 2 (two) times daily as needed (anxiety). (Patient not taking: Reported on 12/10/2022) 60 tablet 0   cetirizine (ZYRTEC) 5 MG tablet Take 10 mg by mouth daily.     cholecalciferol (VITAMIN D3) 25 MCG (1000 UNIT) tablet Take 1,000 Units by mouth  daily.     ibuprofen (ADVIL) 600 MG tablet Take 1 tablet (600 mg total) by mouth every 6 (six) hours. 30 tablet 0   labetalol (NORMODYNE) 200 MG tablet Take 4 tablets (800 mg total) by mouth every 8 (eight) hours. 90 tablet 0   levothyroxine (SYNTHROID) 88 MCG tablet Take 88 mcg by mouth daily before breakfast.     oxyCODONE (OXY IR/ROXICODONE) 5 MG immediate release tablet Take 1 tablet (5 mg total) by mouth every 6 (six) hours as needed for moderate pain or severe pain. 15 tablet 0   pantoprazole (PROTONIX) 40 MG tablet Take 40 mg by mouth daily.     polyethylene glycol (MIRALAX / GLYCOLAX) 17 g packet Take 17 g by mouth daily. 14 each 0   Prenatal Vit-Fe Fumarate-FA (PRENATAL MULTIVITAMIN) TABS tablet Take 1 tablet by mouth daily at 12 noon.     senna-docusate (SENOKOT-S) 8.6-50 MG tablet Take 1 tablet by mouth at bedtime. 30 tablet 0   sertraline (ZOLOFT) 25 MG tablet Take 1 tablet (25 mg total) by mouth daily. (Patient not taking: Reported on 12/10/2022) 39 tablet 0   No current facility-administered medications for this visit.   Family History  Problem Relation Age of Onset   High Cholesterol Mother    Diabetes Father    High blood pressure Father    High  Cholesterol Father    Heart disease Father    Sudden death Father    Social History   Socioeconomic History   Marital status: Married    Spouse name: Nicholos Johns   Number of children: Not on file   Years of education: Not on file   Highest education level: Not on file  Occupational History   Occupation: Designer, multimedia Admin - [employer]  Tobacco Use   Smoking status: Former    Types: Cigarettes    Quit date: 2008    Years since quitting: 16.5   Smokeless tobacco: Never  Vaping Use   Vaping Use: Never used  Substance and Sexual Activity   Alcohol use: Not Currently   Drug use: Never   Sexual activity: Yes  Other Topics Concern   Not on file  Social History Narrative   Not on file   Social  Determinants of Health   Financial Resource Strain: Not on file  Food Insecurity: No Food Insecurity (12/08/2022)   Hunger Vital Sign    Worried About Running Out of Food in the Last Year: Never true    Ran Out of Food in the Last Year: Never true  Transportation Needs: No Transportation Needs (12/08/2022)   PRAPARE - Administrator, Civil Service (Medical): No    Lack of Transportation (Non-Medical): No  Physical Activity: Not on file  Stress: Not on file  Social Connections: Not on file   Review of Systems: {Review Of Systems:30496} Objective:  Physical Exam: Vitals:   12/15/22 1522  BP: 135/87  Pulse: 68  Temp: 98.1 F (36.7 C)  TempSrc: Oral  SpO2: 100%  Weight: 198 lb (89.8 kg)  Height: 5\' 4"  (1.626 m)    Constitutional: NAD, appears comfortable HEENT: Atraumatic, normocephalic. PERRL, anicteric sclera.  Neck: Supple, trachea midline.  Cardiovascular: RRR, no murmurs, rubs, or gallops.  Pulmonary/Chest: CTAB, no wheezes, rales, or rhonchi. No chest wall abnormalities.  Abdominal: Soft, non tender, non distended. +BS.  GU: ***  Extremities: Warm and well perfused. Distal pulses intact. No edema.  Neurological: A&Ox3, CN II - XII grossly intact.  Skin: No rashes or erythema  Psychiatric: Normal mood and affect  Assessment & Plan:   No problem-specific Assessment & Plan notes found for this encounter.

## 2022-12-15 NOTE — Assessment & Plan Note (Signed)
On replacement therapy with recent TSH WNL. Dose was adjusted during her pregnancy and she does state she still experiences fatigue. Plan to keep current dose but monitor and adjust as she recovers from her pregnancy and pancreatitis.

## 2022-12-17 DIAGNOSIS — I10 Essential (primary) hypertension: Secondary | ICD-10-CM | POA: Insufficient documentation

## 2022-12-17 LAB — LIPID PANEL

## 2022-12-17 LAB — MICROALBUMIN / CREATININE URINE RATIO
Creatinine, Urine: 190.7 mg/dL
Microalb/Creat Ratio: 11 mg/g creat (ref 0–29)
Microalbumin, Urine: 21 ug/mL

## 2022-12-17 LAB — CMP14 + ANION GAP

## 2022-12-17 MED ORDER — AMLODIPINE BESYLATE 5 MG PO TABS
5.0000 mg | ORAL_TABLET | Freq: Every day | ORAL | 11 refills | Status: AC
Start: 2022-12-17 — End: 2023-12-17

## 2022-12-17 NOTE — Assessment & Plan Note (Signed)
135/87 today, on labetalol given recent pregnancy. Will transition to amlodipine for now, might be able to stop this med entirely going forward.

## 2022-12-18 LAB — CMP14 + ANION GAP
AST: 18 IU/L (ref 0–40)
Albumin: 4.3 g/dL (ref 3.9–4.9)
Alkaline Phosphatase: 153 IU/L — ABNORMAL HIGH (ref 44–121)
BUN/Creatinine Ratio: 10 (ref 9–23)
Bilirubin Total: <0.2 mg/dL (ref 0.0–1.2)
CO2: 23 mmol/L (ref 20–29)
Chloride: 101 mmol/L (ref 96–106)
Creatinine, Ser: 0.92 mg/dL (ref 0.57–1.00)
Glucose: 115 mg/dL — ABNORMAL HIGH (ref 70–99)
Potassium: 4.1 mmol/L (ref 3.5–5.2)
Sodium: 142 mmol/L (ref 134–144)
Total Protein: 6.9 g/dL (ref 6.0–8.5)
eGFR: 82 mL/min/{1.73_m2} (ref >59)

## 2022-12-18 LAB — VITAMIN D 25 HYDROXY (VIT D DEFICIENCY, FRACTURES): Vit D, 25-Hydroxy: 44.6 ng/mL (ref 30.0–100.0)

## 2022-12-18 LAB — LIPID PANEL: Triglycerides: 204 mg/dL — ABNORMAL HIGH (ref 0–149)

## 2022-12-20 NOTE — Progress Notes (Signed)
Results communicated with patient via phone. BMP/ACR assessed given history of pancreatitis and Pre-E, good results suggesting recovery. Vit D which was low in past is now normal. Cholesterol is elevated - discussed lifestyle management as first line with exercise and diet. Patient agreeable to pursue lifestyle changes first.

## 2022-12-21 NOTE — Progress Notes (Signed)
IM attending.  Pathology finding are now available.  Updated discharge diagnoses will include:  Chronic cholecystitis, cholesterolosis, cholelithiasis is a clinically significant diagnosis

## 2022-12-24 NOTE — Progress Notes (Signed)
Internal Medicine Clinic Attending  I was physically present during the key portions of the resident provided service and participated in the medical decision making of patient's management care. I reviewed pertinent patient test results.  The assessment, diagnosis, and plan were formulated together and I agree with the documentation in the resident's note.  Riverlyn Kizziah, MD  

## 2023-01-30 DIAGNOSIS — M79672 Pain in left foot: Secondary | ICD-10-CM | POA: Diagnosis not present

## 2023-01-30 DIAGNOSIS — M25572 Pain in left ankle and joints of left foot: Secondary | ICD-10-CM | POA: Diagnosis not present

## 2023-01-30 DIAGNOSIS — S93602A Unspecified sprain of left foot, initial encounter: Secondary | ICD-10-CM | POA: Diagnosis not present

## 2023-01-30 DIAGNOSIS — M722 Plantar fascial fibromatosis: Secondary | ICD-10-CM | POA: Diagnosis not present

## 2023-01-30 DIAGNOSIS — S93402A Sprain of unspecified ligament of left ankle, initial encounter: Secondary | ICD-10-CM | POA: Diagnosis not present

## 2023-02-09 DIAGNOSIS — Z713 Dietary counseling and surveillance: Secondary | ICD-10-CM | POA: Diagnosis not present

## 2023-02-22 DIAGNOSIS — Z713 Dietary counseling and surveillance: Secondary | ICD-10-CM | POA: Diagnosis not present

## 2023-02-25 DIAGNOSIS — E039 Hypothyroidism, unspecified: Secondary | ICD-10-CM | POA: Diagnosis not present

## 2023-03-23 DIAGNOSIS — O165 Unspecified maternal hypertension, complicating the puerperium: Secondary | ICD-10-CM | POA: Diagnosis not present

## 2023-04-17 DIAGNOSIS — J0100 Acute maxillary sinusitis, unspecified: Secondary | ICD-10-CM | POA: Diagnosis not present

## 2023-05-09 DIAGNOSIS — J019 Acute sinusitis, unspecified: Secondary | ICD-10-CM | POA: Diagnosis not present

## 2023-05-11 DIAGNOSIS — J45998 Other asthma: Secondary | ICD-10-CM | POA: Diagnosis not present

## 2023-05-11 DIAGNOSIS — J019 Acute sinusitis, unspecified: Secondary | ICD-10-CM | POA: Diagnosis not present

## 2023-06-22 DIAGNOSIS — O165 Unspecified maternal hypertension, complicating the puerperium: Secondary | ICD-10-CM | POA: Diagnosis not present

## 2023-06-22 DIAGNOSIS — E039 Hypothyroidism, unspecified: Secondary | ICD-10-CM | POA: Diagnosis not present

## 2023-08-26 DIAGNOSIS — J069 Acute upper respiratory infection, unspecified: Secondary | ICD-10-CM | POA: Diagnosis not present

## 2023-09-01 DIAGNOSIS — A499 Bacterial infection, unspecified: Secondary | ICD-10-CM | POA: Diagnosis not present

## 2023-11-08 DIAGNOSIS — E039 Hypothyroidism, unspecified: Secondary | ICD-10-CM | POA: Diagnosis not present

## 2023-11-08 DIAGNOSIS — O165 Unspecified maternal hypertension, complicating the puerperium: Secondary | ICD-10-CM | POA: Diagnosis not present

## 2023-11-08 DIAGNOSIS — Z01411 Encounter for gynecological examination (general) (routine) with abnormal findings: Secondary | ICD-10-CM | POA: Diagnosis not present

## 2023-11-08 DIAGNOSIS — Z124 Encounter for screening for malignant neoplasm of cervix: Secondary | ICD-10-CM | POA: Diagnosis not present

## 2023-11-08 DIAGNOSIS — Z1331 Encounter for screening for depression: Secondary | ICD-10-CM | POA: Diagnosis not present

## 2023-11-08 DIAGNOSIS — Z01419 Encounter for gynecological examination (general) (routine) without abnormal findings: Secondary | ICD-10-CM | POA: Diagnosis not present

## 2023-11-21 DIAGNOSIS — M170 Bilateral primary osteoarthritis of knee: Secondary | ICD-10-CM | POA: Diagnosis not present

## 2023-11-21 DIAGNOSIS — M1712 Unilateral primary osteoarthritis, left knee: Secondary | ICD-10-CM | POA: Diagnosis not present

## 2023-11-21 DIAGNOSIS — M1711 Unilateral primary osteoarthritis, right knee: Secondary | ICD-10-CM | POA: Diagnosis not present

## 2024-02-15 DIAGNOSIS — I10 Essential (primary) hypertension: Secondary | ICD-10-CM | POA: Diagnosis not present

## 2024-02-15 DIAGNOSIS — E559 Vitamin D deficiency, unspecified: Secondary | ICD-10-CM | POA: Diagnosis not present

## 2024-02-15 DIAGNOSIS — E7800 Pure hypercholesterolemia, unspecified: Secondary | ICD-10-CM | POA: Diagnosis not present

## 2024-02-15 DIAGNOSIS — Z1331 Encounter for screening for depression: Secondary | ICD-10-CM | POA: Diagnosis not present

## 2024-02-15 DIAGNOSIS — Z131 Encounter for screening for diabetes mellitus: Secondary | ICD-10-CM | POA: Diagnosis not present

## 2024-02-15 DIAGNOSIS — E039 Hypothyroidism, unspecified: Secondary | ICD-10-CM | POA: Diagnosis not present

## 2024-02-15 DIAGNOSIS — Z1339 Encounter for screening examination for other mental health and behavioral disorders: Secondary | ICD-10-CM | POA: Diagnosis not present

## 2024-02-21 DIAGNOSIS — Z1289 Encounter for screening for malignant neoplasm of other sites: Secondary | ICD-10-CM | POA: Diagnosis not present

## 2024-03-26 DIAGNOSIS — Z23 Encounter for immunization: Secondary | ICD-10-CM | POA: Diagnosis not present

## 2024-03-26 DIAGNOSIS — I10 Essential (primary) hypertension: Secondary | ICD-10-CM | POA: Diagnosis not present

## 2024-05-15 DIAGNOSIS — I10 Essential (primary) hypertension: Secondary | ICD-10-CM | POA: Diagnosis not present

## 2024-05-15 DIAGNOSIS — E039 Hypothyroidism, unspecified: Secondary | ICD-10-CM | POA: Diagnosis not present

## 2024-05-15 DIAGNOSIS — M546 Pain in thoracic spine: Secondary | ICD-10-CM | POA: Diagnosis not present

## 2024-06-06 DIAGNOSIS — M546 Pain in thoracic spine: Secondary | ICD-10-CM | POA: Diagnosis not present
